# Patient Record
Sex: Female | Born: 1935 | Race: White | Hispanic: No | Smoking: Former smoker
Health system: Southern US, Community
[De-identification: ages and names within clinical notes are randomized; demographics above are authoritative.]

## PROBLEM LIST (undated history)

## (undated) DIAGNOSIS — I10 Essential (primary) hypertension: Secondary | ICD-10-CM

## (undated) DIAGNOSIS — H409 Unspecified glaucoma: Secondary | ICD-10-CM

## (undated) DIAGNOSIS — F0280 Dementia in other diseases classified elsewhere without behavioral disturbance: Secondary | ICD-10-CM

## (undated) DIAGNOSIS — I519 Heart disease, unspecified: Secondary | ICD-10-CM

## (undated) DIAGNOSIS — M810 Age-related osteoporosis without current pathological fracture: Secondary | ICD-10-CM

## (undated) DIAGNOSIS — G309 Alzheimer's disease, unspecified: Secondary | ICD-10-CM

---

## 2014-05-05 ENCOUNTER — Encounter (HOSPITAL_COMMUNITY): Payer: Self-pay | Admitting: Emergency Medicine

## 2014-05-05 ENCOUNTER — Emergency Department (HOSPITAL_COMMUNITY)
Admission: EM | Admit: 2014-05-05 | Discharge: 2014-05-05 | Disposition: A | Discharge status: Home / Self Care | Payer: Medicare Other | Attending: Emergency Medicine | Admitting: Emergency Medicine

## 2014-05-05 DIAGNOSIS — G309 Alzheimer's disease, unspecified: Secondary | ICD-10-CM | POA: Diagnosis not present

## 2014-05-05 DIAGNOSIS — F0390 Unspecified dementia without behavioral disturbance: Secondary | ICD-10-CM

## 2014-05-05 DIAGNOSIS — R531 Weakness: Secondary | ICD-10-CM | POA: Diagnosis present

## 2014-05-05 DIAGNOSIS — R197 Diarrhea, unspecified: Secondary | ICD-10-CM | POA: Insufficient documentation

## 2014-05-05 DIAGNOSIS — Z79899 Other long term (current) drug therapy: Secondary | ICD-10-CM | POA: Diagnosis not present

## 2014-05-05 DIAGNOSIS — F0280 Dementia in other diseases classified elsewhere without behavioral disturbance: Secondary | ICD-10-CM | POA: Diagnosis not present

## 2014-05-05 DIAGNOSIS — Z7982 Long term (current) use of aspirin: Secondary | ICD-10-CM | POA: Insufficient documentation

## 2014-05-05 LAB — CBC WITH DIFFERENTIAL/PLATELET
BASOS ABS: 0 10*3/uL (ref 0.0–0.1)
BASOS PCT: 1 % (ref 0–1)
Eosinophils Absolute: 0.2 10*3/uL (ref 0.0–0.7)
Eosinophils Relative: 5 % (ref 0–5)
HCT: 44 % (ref 36.0–46.0)
Hemoglobin: 14.1 g/dL (ref 12.0–15.0)
Lymphocytes Relative: 23 % (ref 12–46)
Lymphs Abs: 0.9 10*3/uL (ref 0.7–4.0)
MCH: 31.1 pg (ref 26.0–34.0)
MCHC: 32 g/dL (ref 30.0–36.0)
MCV: 96.9 fL (ref 78.0–100.0)
Monocytes Absolute: 0.3 10*3/uL (ref 0.1–1.0)
Monocytes Relative: 8 % (ref 3–12)
NEUTROS PCT: 63 % (ref 43–77)
Neutro Abs: 2.5 10*3/uL (ref 1.7–7.7)
PLATELETS: 163 10*3/uL (ref 150–400)
RBC: 4.54 MIL/uL (ref 3.87–5.11)
RDW: 13.6 % (ref 11.5–15.5)
WBC: 4 10*3/uL (ref 4.0–10.5)

## 2014-05-05 LAB — URINALYSIS, ROUTINE W REFLEX MICROSCOPIC
Bilirubin Urine: NEGATIVE
GLUCOSE, UA: NEGATIVE mg/dL
Hgb urine dipstick: NEGATIVE
KETONES UR: NEGATIVE mg/dL
LEUKOCYTES UA: NEGATIVE
Nitrite: NEGATIVE
PH: 7.5 (ref 5.0–8.0)
Protein, ur: NEGATIVE mg/dL
Specific Gravity, Urine: 1.006 (ref 1.005–1.030)
Urobilinogen, UA: 0.2 mg/dL (ref 0.0–1.0)

## 2014-05-05 LAB — COMPREHENSIVE METABOLIC PANEL
ALBUMIN: 3.7 g/dL (ref 3.5–5.2)
ALT: 9 U/L (ref 0–35)
AST: 20 U/L (ref 0–37)
Alkaline Phosphatase: 97 U/L (ref 39–117)
Anion gap: 11 (ref 5–15)
BUN: 18 mg/dL (ref 6–23)
CHLORIDE: 102 meq/L (ref 96–112)
CO2: 27 mEq/L (ref 19–32)
CREATININE: 1.02 mg/dL (ref 0.50–1.10)
Calcium: 10.1 mg/dL (ref 8.4–10.5)
GFR calc Af Amer: 52 mL/min — ABNORMAL LOW (ref >90)
GFR calc non Af Amer: 45 mL/min — ABNORMAL LOW (ref >90)
Glucose, Bld: 93 mg/dL (ref 70–99)
POTASSIUM: 4.3 meq/L (ref 3.7–5.3)
Sodium: 140 mEq/L (ref 137–147)
Total Bilirubin: 0.5 mg/dL (ref 0.3–1.2)
Total Protein: 7.2 g/dL (ref 6.0–8.3)

## 2014-05-05 LAB — I-STAT TROPONIN, ED: Troponin i, poc: 0.01 ng/mL (ref 0.00–0.08)

## 2014-05-05 NOTE — Discharge Instructions (Signed)
Diarrhea °Diarrhea is watery poop (stool). It can make you feel weak, tired, thirsty, or give you a dry mouth (signs of dehydration). Watery poop is a sign of another problem, most often an infection. It often lasts 2-3 days. It can last longer if it is a sign of something serious. Take care of yourself as told by your doctor. °HOME CARE  °· Drink 1 cup (8 ounces) of fluid each time you have watery poop. °· Do not drink the following fluids: °¨ Those that contain simple sugars (fructose, glucose, galactose, lactose, sucrose, maltose). °¨ Sports drinks. °¨ Fruit juices. °¨ Whole milk products. °¨ Sodas. °¨ Drinks with caffeine (coffee, tea, soda) or alcohol. °· Oral rehydration solution may be used if the doctor says it is okay. You may make your own solution. Follow this recipe: °¨  - teaspoon table salt. °¨ ¾ teaspoon baking soda. °¨  teaspoon salt substitute containing potassium chloride. °¨ 1 tablespoons sugar. °¨ 1 liter (34 ounces) of water. °· Avoid the following foods: °¨ High fiber foods, such as raw fruits and vegetables. °¨ Nuts, seeds, and whole grain breads and cereals. °¨  Those that are sweetened with sugar alcohols (xylitol, sorbitol, mannitol). °· Try eating the following foods: °¨ Starchy foods, such as rice, toast, pasta, low-sugar cereal, oatmeal, baked potatoes, crackers, and bagels. °¨ Bananas. °¨ Applesauce. °· Eat probiotic-rich foods, such as yogurt and milk products that are fermented. °· Wash your hands well after each time you have watery poop. °· Only take medicine as told by your doctor. °· Take a warm bath to help lessen burning or pain from having watery poop. °GET HELP RIGHT AWAY IF:  °· You cannot drink fluids without throwing up (vomiting). °· You keep throwing up. °· You have blood in your poop, or your poop looks black and tarry. °· You do not pee (urinate) in 6-8 hours, or there is only a small amount of very dark pee. °· You have belly (abdominal) pain that gets worse or stays  in the same spot (localizes). °· You are weak, dizzy, confused, or light-headed. °· You have a very bad headache. °· Your watery poop gets worse or does not get better. °· You have a fever or lasting symptoms for more than 2-3 days. °· You have a fever and your symptoms suddenly get worse. °MAKE SURE YOU:  °· Understand these instructions. °· Will watch your condition. °· Will get help right away if you are not doing well or get worse. °Document Released: 12/15/2007 Document Revised: 11/12/2013 Document Reviewed: 03/05/2012 °ExitCare® Patient Information ©2015 [employer]. This information is not intended to replace advice given to you by your health care provider. Make sure you discuss any questions you have with your health care provider. ° °

## 2014-05-05 NOTE — ED Notes (Signed)
[facility] called  

## 2014-05-05 NOTE — ED Notes (Signed)
Patient up ambulating very well

## 2014-05-05 NOTE — ED Notes (Signed)
[facility]Per EMS pt c/o generalized weakness for a few days and and instance of diarrhea today. Pt sts that she believes the facility is giving her the wrong medication. Pt has hx of Alzheimer's. Pt is A&O and in NAD.

## 2014-05-05 NOTE — ED Provider Notes (Signed)
CSN: [id number]     Arrival date & time 05/05/14  1221 History   First MD Initiated Contact with Patient 05/05/14 1256     Chief Complaint  Patient presents with  . Weakness      [facility] EMS pt c/o generalized weakness for a few days and and instance of diarrhea today. Pt sts that she believes the facility is giving her the wrong medication. Pt has hx of Alzheimer's.  History reviewed. No pertinent past medical history. History reviewed. No pertinent past surgical history. No family history on file. History  Substance Use Topics  . Smoking status: Not on file  . Smokeless tobacco: Not on file  . Alcohol Use: No   OB History   Grav Para Term Preterm Abortions TAB SAB Ect Mult Living                 Review of Systems  Unable to perform ROS: Dementia      Allergies  Dairy aid and Eggs or egg-derived products  Home Medications   Prior to Admission medications   Medication Sig Start Date End Date Taking? Authorizing Provider  acetaminophen (TYLENOL) 500 MG tablet Take 500 mg by mouth every 4 (four) hours as needed for mild pain, fever or headache.   Yes Historical Provider, MD  alendronate (FOSAMAX) 70 MG tablet Take 70 mg by mouth once a week. On wednesdays   Yes Historical Provider, MD  alum & mag hydroxide-simeth (MAALOX/MYLANTA) 200-200-20 MG/5ML suspension Take 30 mLs by mouth every 6 (six) hours as needed for indigestion or heartburn (do not exceed 4 doses in 24 hours).   Yes Historical Provider, MD  aspirin 81 MG tablet Take 81 mg by mouth daily.   Yes Historical Provider, MD  benazepril (LOTENSIN) 20 MG tablet Take 20 mg by mouth daily.   Yes Historical Provider, MD  Calcium Carb-Cholecalciferol (CALCIUM-VITAMIN D) 600-400 MG-UNIT TABS Take 1 tablet by mouth 2 (two) times daily.   Yes Historical Provider, MD  cefaclor (CECLOR) 250 MG capsule Take 250 mg by mouth daily.   Yes Historical Provider, MD  citalopram (CELEXA) 20 MG tablet Take  20 mg by mouth daily.   Yes Historical Provider, MD  furosemide (LASIX) 20 MG tablet Take 20 mg by mouth daily as needed.   Yes Historical Provider, MD  guaifenesin (ROBITUSSIN) 100 MG/5ML syrup Take 200 mg by mouth every 6 (six) hours as needed for cough.   Yes Historical Provider, MD  loperamide (IMODIUM) 2 MG capsule Take 2 mg by mouth as needed for diarrhea or loose stools (do not exceed 8 doses in 24 hours).   Yes Historical Provider, MD  magnesium hydroxide (MILK OF MAGNESIA) 400 MG/5ML suspension Take 30 mLs by mouth daily as needed for mild constipation.   Yes Historical Provider, MD  Memantine HCl ER (NAMENDA XR) 28 MG CP24 Take 1 capsule by mouth daily.   Yes Historical Provider, MD  neomycin-bacitracin-polymyxin (NEOSPORIN) ointment Apply 1 application topically as needed for wound care. apply to eye   Yes Historical Provider, MD  propranolol (INDERAL) 20 MG tablet Take 20 mg by mouth 2 (two) times daily.   Yes Historical Provider, MD  Vitamin D, Ergocalciferol, (DRISDOL) 50000 UNITS CAPS capsule Take 50,000 Units by mouth every 7 (seven) days. On wednesday   Yes Historical Provider, MD   BP 212/82  Pulse 54  Temp(Src) 97.9 F (36.6 C) (Oral)  Resp 10  SpO2 100% Physical Exam  Physical Exam  Nursing note and vitals reviewed. Constitutional: Patient is awake and talkative.  She appears well-developed and well-nourished. No distress.  HENT:  Head: Normocephalic and atraumatic.  Eyes: Pupils are equal, round, and reactive to light.  Neck: Normal range of motion.  Cardiovascular: Normal rate and intact distal pulses.   Pulmonary/Chest: No respiratory distress.  Abdominal: Normal appearance. She exhibits no distension.  Musculoskeletal: Normal range of motion.  Neurological: She is alert and oriented to person, place, and time. No cranial nerve deficit.  Skin: Skin is warm and dry. No rash noted.  Psychiatric: She has a normal mood and affect. Her behavior is normal.   ED Course   Procedures (including critical care time) Medications - No data to display  Labs Review Labs Reviewed  COMPREHENSIVE METABOLIC PANEL - Abnormal; Notable for the following:    GFR calc non Af Amer 45 (*)    GFR calc Af Amer 52 (*)    All other components within normal limits  CBC WITH DIFFERENTIAL  URINALYSIS, ROUTINE W REFLEX MICROSCOPIC  I-STAT TROPOININ, ED    Imaging Review No results found.   EKG Interpretation   Date/Time:  Sunday May 05 2014 13:10:05 EDT Ventricular Rate:  55 PR Interval:  184 QRS Duration: 82 QT Interval:  465 QTC Calculation: 445 R Axis:   45 Text Interpretation:  Sinus rhythm Atrial premature complex Probable  anterior infarct, age indeterminate Confirmed by Darothy Courtright  MD, Mendel Binsfeld  [phone]([id number]) on 05/05/2014 2:31:57 PM     Patient was ambulated in the hall and was happy and dancing.  At this time she appears stable for discharge MDM   Final diagnoses:  Dementia, without behavioral disturbance  Diarrhea        Nelia Shiobert L Inigo Lantigua, MD 05/05/14 1435

## 2014-05-05 NOTE — ED Notes (Signed)
Bed: WA01 Expected date: 05/05/14 Expected time: 12:06 PM Means of arrival: Ambulance Comments: Gen weakness

## 2014-07-08 ENCOUNTER — Emergency Department (HOSPITAL_COMMUNITY)
Admission: EM | Admit: 2014-07-08 | Discharge: 2014-07-08 | Disposition: A | Discharge status: Home / Self Care | Payer: Medicare Other | Attending: Emergency Medicine | Admitting: Emergency Medicine

## 2014-07-08 ENCOUNTER — Encounter (HOSPITAL_COMMUNITY): Payer: Self-pay

## 2014-07-08 DIAGNOSIS — Y998 Other external cause status: Secondary | ICD-10-CM | POA: Diagnosis not present

## 2014-07-08 DIAGNOSIS — Y9289 Other specified places as the place of occurrence of the external cause: Secondary | ICD-10-CM | POA: Diagnosis not present

## 2014-07-08 DIAGNOSIS — Z79899 Other long term (current) drug therapy: Secondary | ICD-10-CM | POA: Insufficient documentation

## 2014-07-08 DIAGNOSIS — Y9389 Activity, other specified: Secondary | ICD-10-CM | POA: Insufficient documentation

## 2014-07-08 DIAGNOSIS — S0532XA Ocular laceration without prolapse or loss of intraocular tissue, left eye, initial encounter: Secondary | ICD-10-CM | POA: Insufficient documentation

## 2014-07-08 DIAGNOSIS — W01198A Fall on same level from slipping, tripping and stumbling with subsequent striking against other object, initial encounter: Secondary | ICD-10-CM | POA: Diagnosis not present

## 2014-07-08 DIAGNOSIS — S0181XA Laceration without foreign body of other part of head, initial encounter: Secondary | ICD-10-CM

## 2014-07-08 DIAGNOSIS — Z23 Encounter for immunization: Secondary | ICD-10-CM | POA: Insufficient documentation

## 2014-07-08 DIAGNOSIS — W010XXA Fall on same level from slipping, tripping and stumbling without subsequent striking against object, initial encounter: Secondary | ICD-10-CM

## 2014-07-08 MED ORDER — TETANUS-DIPHTH-ACELL PERTUSSIS 5-2.5-18.5 LF-MCG/0.5 IM SUSP
0.5000 mL | Freq: Once | INTRAMUSCULAR | Status: AC
  Administered 2014-07-08: 0.5 mL via INTRAMUSCULAR
  Filled 2014-07-08: qty 0.5

## 2014-07-08 MED ORDER — ACETAMINOPHEN 325 MG PO TABS
650.0000 mg | ORAL_TABLET | Freq: Once | ORAL | Status: AC
  Administered 2014-07-08: 650 mg via ORAL
  Filled 2014-07-08: qty 2

## 2014-07-08 NOTE — ED Notes (Signed)
Facility called and made aware that pt's DNR form was left in the ER. Form left behind [employer].

## 2014-07-08 NOTE — ED Notes (Signed)
Bed: WHALD Expected date:  Expected time:  Means of arrival:  Comments: EMS- FALL 

## 2014-07-08 NOTE — ED Notes (Signed)
MD at bedside. 

## 2014-07-08 NOTE — ED Notes (Signed)
Bed: WA21 Expected date:  Expected time:  Means of arrival:  Comments: Pt in endoscopy; will return

## 2014-07-08 NOTE — Discharge Instructions (Signed)
It was our pleasure to provide your ER care today - we hope that you feel better.  Keep wound very clean. May gently wash with warm water and soap 1-2x/day.  Have sutures removed, by your doctor/PA, in 1 week.   May take tylenol as need.   Use your walker for added stability when walking, and to help minimize the risk of falling.   Return to ER if worse, change in mental status, vomiting, infection of wound, severe headache, other concern.     Facial Laceration  A facial laceration is a cut on the face. These injuries can be painful and cause bleeding. Lacerations usually heal quickly, but they need special care to reduce scarring. DIAGNOSIS  Your health care provider will take a medical history, ask for details about how the injury occurred, and examine the wound to determine how deep the cut is. TREATMENT  Some facial lacerations may not require closure. Others may not be able to be closed because of an increased risk of infection. The risk of infection and the chance for successful closure will depend on various factors, including the amount of time since the injury occurred. The wound may be cleaned to help prevent infection. If closure is appropriate, pain medicines may be given if needed. Your health care provider will use stitches (sutures), wound glue (adhesive), or skin adhesive strips to repair the laceration. These tools bring the skin edges together to allow for faster healing and a better cosmetic outcome. If needed, you may also be given a tetanus shot. HOME CARE INSTRUCTIONS  Only take over-the-counter or prescription medicines as directed by your health care provider.  Follow your health care provider's instructions for wound care. These instructions will vary depending on the technique used for closing the wound. For Sutures:  Keep the wound clean and dry.   If you were given a bandage (dressing), you should change it at least once a day. Also change the dressing if  it becomes wet or dirty, or as directed by your health care provider.   Wash the wound with soap and water 2 times a day. Rinse the wound off with water to remove all soap. Pat the wound dry with a clean towel.   After cleaning, apply a thin layer of the antibiotic ointment recommended by your health care provider. This will help prevent infection and keep the dressing from sticking.   You may shower as usual after the first 24 hours. Do not soak the wound in water until the sutures are removed.   Get your sutures removed as directed by your health care provider. With facial lacerations, sutures should usually be taken out after 4-5 days to avoid stitch marks.   Wait a few days after your sutures are removed before applying any makeup. For Skin Adhesive Strips:  Keep the wound clean and dry.   Do not get the skin adhesive strips wet. You may bathe carefully, using caution to keep the wound dry.   If the wound gets wet, pat it dry with a clean towel.   Skin adhesive strips will fall off on their own. You may trim the strips as the wound heals. Do not remove skin adhesive strips that are still stuck to the wound. They will fall off in time.  For Wound Adhesive:  You may briefly wet your wound in the shower or bath. Do not soak or scrub the wound. Do not swim. Avoid periods of heavy sweating until the skin adhesive has  fallen off on its own. After showering or bathing, gently pat the wound dry with a clean towel.   Do not apply liquid medicine, cream medicine, ointment medicine, or makeup to your wound while the skin adhesive is in place. This may loosen the film before your wound is healed.   If a dressing is placed over the wound, be careful not to apply tape directly over the skin adhesive. This may cause the adhesive to be pulled off before the wound is healed.   Avoid prolonged exposure to sunlight or tanning lamps while the skin adhesive is in place.  The skin adhesive  will usually remain in place for 5-10 days, then naturally fall off the skin. Do not pick at the adhesive film.  After Healing: Once the wound has healed, cover the wound with sunscreen during the day for 1 full year. This can help minimize scarring. Exposure to ultraviolet light in the first year will darken the scar. It can take 1-2 years for the scar to lose its redness and to heal completely.  SEEK IMMEDIATE MEDICAL CARE IF:  You have redness, pain, or swelling around the wound.   You see ayellowish-white fluid (pus) coming from the wound.   You have chills or a fever.  MAKE SURE YOU:  Understand these instructions.  Will watch your condition.  Will get help right away if you are not doing well or get worse. Document Released: 08/05/2004 Document Revised: 04/18/2013 Document Reviewed: 02/08/2013 [facility] Patient Information 2015 [place], [place]. This information is not intended to replace advice given to you by your health care provider. Make sure you discuss any questions you have with your health care provider.     Fall Prevention and Home Safety Falls cause injuries and can affect all age groups. It is possible to prevent falls.  HOW TO PREVENT FALLS  Wear shoes with rubber soles that do not have an opening for your toes.  Keep the inside and outside of your house well lit.  Use night lights throughout your home.  Remove clutter from floors.  Clean up floor spills.  Remove throw rugs or fasten them to the floor with carpet tape.  Do not place electrical cords across pathways.  Put grab bars by your tub, shower, and toilet. Do not use towel bars as grab bars.  Put handrails on both sides of the stairway. Fix loose handrails.  Do not climb on stools or stepladders, if possible.  Do not wax your floors.  Repair uneven or unsafe sidewalks, walkways, or stairs.  Keep items you use a lot within reach.  Be aware of pets.  Keep emergency numbers next to the  telephone.  Put smoke detectors in your home and near bedrooms. Ask your doctor what other things you can do to prevent falls. Document Released: 04/24/2009 Document Revised: 12/28/2011 Document Reviewed: 09/28/2011 [facility] Patient Information 2015 [place], [place]. This information is not intended to replace advice given to you by your health care provider. Make sure you discuss any questions you have with your health care provider.    Facial or Scalp Contusion A facial or scalp contusion is a deep bruise on the face or head. Injuries to the face and head generally cause a lot of swelling, especially around the eyes. Contusions are the result of an injury that caused bleeding under the skin. The contusion may turn blue, purple, or yellow. Minor injuries will give you a painless contusion, but more severe contusions may stay painful and  swollen for a few weeks.  CAUSES  A facial or scalp contusion is caused by a blunt injury or trauma to the face or head area.  SIGNS AND SYMPTOMS  Swelling of the injured area.  Discoloration of the injured area.  Tenderness, soreness, or pain in the injured area.  DIAGNOSIS  The diagnosis can be made by taking a medical history and doing a physical exam. An X-ray exam, CT scan, or MRI may be needed to determine if there are any associated injuries, such as broken bones (fractures). TREATMENT  Often, the best treatment for a facial or scalp contusion is applying cold compresses to the injured area. Over-the-counter medicines may also be recommended for pain control.  HOME CARE INSTRUCTIONS  Only take over-the-counter or prescription medicines as directed by your health care provider.  Apply ice to the injured area.  Put ice in a plastic bag.  Place a towel between your skin and the bag.  Leave the ice on for 20 minutes, 2-3 times a day.  SEEK MEDICAL CARE IF: You have bite problems.  You have pain with chewing.  You are concerned about facial  defects. SEEK IMMEDIATE MEDICAL CARE IF: You have severe pain or a headache that is not relieved by medicine.  You have unusual sleepiness, confusion, or personality changes.  You throw up (vomit).  You have a persistent nosebleed.  You have double vision or blurred vision.  You have fluid drainage from your nose or ear.  You have difficulty walking or using your arms or legs.  MAKE SURE YOU:  Understand these instructions. Will watch your condition. Will get help right away if you are not doing well or get worse. Document Released: 08/05/2004 Document Revised: 04/18/2013 Document Reviewed: 02/08/2013 [facility] Patient Information 2015 [place], [place]. This information is not intended to replace advice given to you by your health care provider. Make sure you discuss any questions you have with your health care provider.     Laceration Care, Adult A laceration is a cut or lesion that goes through all layers of the skin and into the tissue just beneath the skin. TREATMENT  Some lacerations may not require closure. Some lacerations may not be able to be closed due to an increased risk of infection. It is important to see your caregiver as soon as possible after an injury to minimize the risk of infection and maximize the opportunity for successful closure. If closure is appropriate, pain medicines may be given, if needed. The wound will be cleaned to help prevent infection. Your caregiver will use stitches (sutures), staples, wound glue (adhesive), or skin adhesive strips to repair the laceration. These tools bring the skin edges together to allow for faster healing and a better cosmetic outcome. However, all wounds will heal with a scar. Once the wound has healed, scarring can be minimized by covering the wound with sunscreen during the day for 1 full year. HOME CARE INSTRUCTIONS  For sutures or staples:  Keep the wound clean and dry.  If you were given a bandage (dressing), you  should change it at least once a day. Also, change the dressing if it becomes wet or dirty, or as directed by your caregiver.  Wash the wound with soap and water 2 times a day. Rinse the wound off with water to remove all soap. Pat the wound dry with a clean towel.  After cleaning, apply a thin layer of the antibiotic ointment as recommended by your caregiver. This will help  prevent infection and keep the dressing from sticking.  You may shower as usual after the first 24 hours. Do not soak the wound in water until the sutures are removed.  Only take over-the-counter or prescription medicines for pain, discomfort, or fever as directed by your caregiver.  Get your sutures or staples removed as directed by your caregiver. For skin adhesive strips:  Keep the wound clean and dry.  Do not get the skin adhesive strips wet. You may bathe carefully, using caution to keep the wound dry.  If the wound gets wet, pat it dry with a clean towel.  Skin adhesive strips will fall off on their own. You may trim the strips as the wound heals. Do not remove skin adhesive strips that are still stuck to the wound. They will fall off in time. For wound adhesive:  You may briefly wet your wound in the shower or bath. Do not soak or scrub the wound. Do not swim. Avoid periods of heavy perspiration until the skin adhesive has fallen off on its own. After showering or bathing, gently pat the wound dry with a clean towel.  Do not apply liquid medicine, cream medicine, or ointment medicine to your wound while the skin adhesive is in place. This may loosen the film before your wound is healed.  If a dressing is placed over the wound, be careful not to apply tape directly over the skin adhesive. This may cause the adhesive to be pulled off before the wound is healed.  Avoid prolonged exposure to sunlight or tanning lamps while the skin adhesive is in place. Exposure to ultraviolet light in the first year will darken  the scar.  The skin adhesive will usually remain in place for 5 to 10 days, then naturally fall off the skin. Do not pick at the adhesive film. You may need a tetanus shot if:  You cannot remember when you had your last tetanus shot.  You have never had a tetanus shot. If you get a tetanus shot, your arm may swell, get red, and feel warm to the touch. This is common and not a problem. If you need a tetanus shot and you choose not to have one, there is a rare chance of getting tetanus. Sickness from tetanus can be serious. SEEK MEDICAL CARE IF:   You have redness, swelling, or increasing pain in the wound.  You see a red line that goes away from the wound.  You have yellowish-white fluid (pus) coming from the wound.  You have a fever.  You notice a bad smell coming from the wound or dressing.  Your wound breaks open before or after sutures have been removed.  You notice something coming out of the wound such as wood or glass.  Your wound is on your hand or foot and you cannot move a finger or toe. SEEK IMMEDIATE MEDICAL CARE IF:   Your pain is not controlled with prescribed medicine.  You have severe swelling around the wound causing pain and numbness or a change in color in your arm, hand, leg, or foot.  Your wound splits open and starts bleeding.  You have worsening numbness, weakness, or loss of function of any joint around or beyond the wound.  You develop painful lumps near the wound or on the skin anywhere on your body. MAKE SURE YOU:   Understand these instructions.  Will watch your condition.  Will get help right away if you are not doing well or get  worse. Document Released: 06/28/2005 Document Revised: 09/20/2011 Document Reviewed: 12/22/2010 [facility] Patient Information 2015 [place], [place]. This information is not intended to replace advice given to you by your health care provider. Make sure you discuss any questions you have with your health care  provider.

## 2014-07-08 NOTE — ED Provider Notes (Signed)
CSN: [id number]     Arrival date & time 07/08/14  1300 History   First MD Initiated Contact with Patient 07/08/14 1312     Chief Complaint  Patient presents with  . Fall     (Consider location/radiation/quality/duration/timing/severity/associated sxs/prior Treatment) HPI Pt from [facility], s/p fall.  Pt states she slipped on some water on the floor, fell and hit left side of face, just lateral to left eye. Small laceration to area. Tetanus unknown. Denies loc. No faintness or dizziness prior to fall. No cp or sob. No palpitations. No headache. Denies nv.  Pt denies neck or back pain. Denies extremity pain or injury.  States she has a walker, but prefers not to use it. Denies other recent falls.   Pt states otherwise recent health at baseline.     History reviewed. No pertinent past medical history. History reviewed. No pertinent past surgical history. No family history on file. History  Substance Use Topics  . Smoking status: Not on file  . Smokeless tobacco: Not on file  . Alcohol Use: No   OB History    No data available     Review of Systems  Constitutional: Negative for fever and chills.  HENT: Negative for nosebleeds.   Eyes: Negative for pain and visual disturbance.  Respiratory: Negative for shortness of breath.   Cardiovascular: Negative for chest pain.  Gastrointestinal: Negative for vomiting, abdominal pain and diarrhea.  Genitourinary: Negative for dysuria and flank pain.  Musculoskeletal: Negative for back pain and neck pain.  Skin: Positive for wound. Negative for rash.  Neurological: Negative for weakness, numbness and headaches.  Hematological: Does not bruise/bleed easily.  Psychiatric/Behavioral: Negative for confusion.      Allergies  Dairy aid and Eggs or egg-derived products  Home Medications   Prior to Admission medications   Medication Sig Start Date End Date Taking? Authorizing Provider  alendronate (FOSAMAX) 70 MG tablet Take 70 mg by mouth once  a week. On wednesdays   Yes Historical Provider, MD  acetaminophen (TYLENOL) 500 MG tablet Take 500 mg by mouth every 4 (four) hours as needed for mild pain, fever or headache.    Historical Provider, MD  alum & mag hydroxide-simeth (MAALOX/MYLANTA) 200-200-20 MG/5ML suspension Take 30 mLs by mouth every 6 (six) hours as needed for indigestion or heartburn (do not exceed 4 doses in 24 hours).    Historical Provider, MD  benazepril (LOTENSIN) 20 MG tablet Take 20 mg by mouth daily.    Historical Provider, MD  Calcium Carb-Cholecalciferol (CALCIUM-VITAMIN D) 600-400 MG-UNIT TABS Take 1 tablet by mouth 2 (two) times daily.    Historical Provider, MD  cefaclor (CECLOR) 250 MG capsule Take 250 mg by mouth daily.    Historical Provider, MD  citalopram (CELEXA) 20 MG tablet Take 20 mg by mouth daily.    Historical Provider, MD  furosemide (LASIX) 20 MG tablet Take 20 mg by mouth daily as needed.    Historical Provider, MD  guaifenesin (ROBITUSSIN) 100 MG/5ML syrup Take 200 mg by mouth every 6 (six) hours as needed for cough.    Historical Provider, MD  loperamide (IMODIUM) 2 MG capsule Take 2 mg by mouth as needed for diarrhea or loose stools (do not exceed 8 doses in 24 hours).    Historical Provider, MD  magnesium hydroxide (MILK OF MAGNESIA) 400 MG/5ML suspension Take 30 mLs by mouth daily as needed for mild constipation.    Historical Provider, MD  Memantine HCl ER (NAMENDA XR) 28 MG  CP24 Take 1 capsule by mouth daily.    Historical Provider, MD  neomycin-bacitracin-polymyxin (NEOSPORIN) ointment Apply 1 application topically as needed for wound care. apply to eye    Historical Provider, MD  propranolol (INDERAL) 20 MG tablet Take 20 mg by mouth 2 (two) times daily.    Historical Provider, MD  Vitamin D, Ergocalciferol, (DRISDOL) 50000 UNITS CAPS capsule Take 50,000 Units by mouth every 7 (seven) days. On wednesday    Historical Provider, MD   BP 172/68 mmHg  Pulse 55  Temp(Src) 98 F (36.7 C) (Oral)   Resp 18  SpO2 95% Physical Exam  Constitutional: She appears well-developed and well-nourished. No distress.  HENT:  Nose: Nose normal.  Mouth/Throat: Oropharynx is clear and moist.  1.5 cm laceration lateral to left eye. No fb seen or felt. Facial bones/orbits grossly intact.   Eyes: Conjunctivae and EOM are normal. Pupils are equal, round, and reactive to light. No scleral icterus.  Neck: Normal range of motion. Neck supple. No tracheal deviation present.  Cardiovascular: Normal rate, regular rhythm, normal heart sounds and intact distal pulses.   Pulmonary/Chest: Effort normal and breath sounds normal. No respiratory distress. She exhibits no tenderness.  Abdominal: Soft. Normal appearance and bowel sounds are normal. She exhibits no distension. There is no tenderness.  Musculoskeletal: Normal range of motion. She exhibits no edema or tenderness.  CTLS spine, non tender, aligned, no step off.   Neurological: She is alert.  Alert, oriented. Motor intact bil, stre 5/5. sens intact. Ambulates to bathroom.   Skin: Skin is warm and dry. No rash noted. She is not diaphoretic.  Psychiatric: She has a normal mood and affect.  Nursing note and vitals reviewed.   ED Course  Procedures (including critical care time) Labs Review      MDM   Pt s/p slip and fall at [facility].  Pt denies loc, no headache.  Wound sutured.  Pt continues to deny headache.  No nv. Spine nt.  Pt ambulatory to bathroom, states feels fine.   Recheck family here. pts mental status remains c/w baseline.  No headache. Spine nt.  No bony tenderness or pain on bil ext exam.   Tetanus updated. Tylenol po.  Pt appears stable for d/c.     Suzi RootsKevin E Danna Casella, MD 07/08/14 [phone]

## 2014-07-08 NOTE — ED Notes (Signed)
[facility]. Pt had an unwitnessed fall. Pt reports she slipped on some water, fell and hit her head. Pt has a laceration to her left eyebrow, bleeding controlled. Pt has some hx of memory loss, no dementia noted. Pt was able to ambulate after the fall.

## 2014-07-16 ENCOUNTER — Emergency Department (HOSPITAL_COMMUNITY)
Admission: EM | Admit: 2014-07-16 | Discharge: 2014-07-16 | Disposition: A | Discharge status: Home / Self Care | Payer: Medicare Other | Attending: Emergency Medicine | Admitting: Emergency Medicine

## 2014-07-16 ENCOUNTER — Encounter (HOSPITAL_COMMUNITY): Payer: Self-pay | Admitting: Emergency Medicine

## 2014-07-16 DIAGNOSIS — K644 Residual hemorrhoidal skin tags: Secondary | ICD-10-CM | POA: Diagnosis not present

## 2014-07-16 DIAGNOSIS — K625 Hemorrhage of anus and rectum: Secondary | ICD-10-CM | POA: Diagnosis present

## 2014-07-16 DIAGNOSIS — Z79899 Other long term (current) drug therapy: Secondary | ICD-10-CM | POA: Insufficient documentation

## 2014-07-16 DIAGNOSIS — Z7982 Long term (current) use of aspirin: Secondary | ICD-10-CM | POA: Diagnosis not present

## 2014-07-16 DIAGNOSIS — K649 Unspecified hemorrhoids: Secondary | ICD-10-CM

## 2014-07-16 LAB — CBC WITH DIFFERENTIAL/PLATELET
BASOS ABS: 0 10*3/uL (ref 0.0–0.1)
BASOS PCT: 1 % (ref 0–1)
EOS PCT: 7 % — AB (ref 0–5)
Eosinophils Absolute: 0.3 10*3/uL (ref 0.0–0.7)
HCT: 40.7 % (ref 36.0–46.0)
Hemoglobin: 13.3 g/dL (ref 12.0–15.0)
Lymphocytes Relative: 21 % (ref 12–46)
Lymphs Abs: 0.8 10*3/uL (ref 0.7–4.0)
MCH: 30.6 pg (ref 26.0–34.0)
MCHC: 32.7 g/dL (ref 30.0–36.0)
MCV: 93.6 fL (ref 78.0–100.0)
Monocytes Absolute: 0.3 10*3/uL (ref 0.1–1.0)
Monocytes Relative: 8 % (ref 3–12)
NEUTROS ABS: 2.4 10*3/uL (ref 1.7–7.7)
Neutrophils Relative %: 63 % (ref 43–77)
Platelets: 124 10*3/uL — ABNORMAL LOW (ref 150–400)
RBC: 4.35 MIL/uL (ref 3.87–5.11)
RDW: 13.4 % (ref 11.5–15.5)
WBC: 3.7 10*3/uL — AB (ref 4.0–10.5)

## 2014-07-16 LAB — PROTIME-INR
INR: 1.03 (ref 0.00–1.49)
Prothrombin Time: 13.6 seconds (ref 11.6–15.2)

## 2014-07-16 LAB — COMPREHENSIVE METABOLIC PANEL
ALT: 11 U/L (ref 0–35)
ANION GAP: 8 (ref 5–15)
AST: 22 U/L (ref 0–37)
Albumin: 3.4 g/dL — ABNORMAL LOW (ref 3.5–5.2)
Alkaline Phosphatase: 74 U/L (ref 39–117)
BUN: 19 mg/dL (ref 6–23)
CALCIUM: 9.3 mg/dL (ref 8.4–10.5)
CO2: 29 mmol/L (ref 19–32)
CREATININE: 1.12 mg/dL — AB (ref 0.50–1.10)
Chloride: 104 mEq/L (ref 96–112)
GFR calc Af Amer: 47 mL/min — ABNORMAL LOW (ref >90)
GFR calc non Af Amer: 40 mL/min — ABNORMAL LOW (ref >90)
Glucose, Bld: 91 mg/dL (ref 70–99)
Potassium: 4.1 mmol/L (ref 3.5–5.1)
SODIUM: 141 mmol/L (ref 135–145)
TOTAL PROTEIN: 6.2 g/dL (ref 6.0–8.3)
Total Bilirubin: 0.7 mg/dL (ref 0.3–1.2)

## 2014-07-16 LAB — POC OCCULT BLOOD, ED: FECAL OCCULT BLD: NEGATIVE

## 2014-07-16 MED ORDER — BENAZEPRIL HCL 20 MG PO TABS
20.0000 mg | ORAL_TABLET | Freq: Once | ORAL | Status: AC
  Administered 2014-07-16: 20 mg via ORAL
  Filled 2014-07-16 (×2): qty 1

## 2014-07-16 MED ORDER — PROPRANOLOL HCL 20 MG PO TABS
20.0000 mg | ORAL_TABLET | Freq: Once | ORAL | Status: AC
  Administered 2014-07-16: 20 mg via ORAL
  Filled 2014-07-16: qty 1

## 2014-07-16 NOTE — Discharge Instructions (Signed)
Bloody Stools °Bloody stools often mean that there is a problem in the digestive tract. Your caregiver may use the term "melena" to describe black, tarry, and bad smelling stools or "hematochezia" to describe red or maroon-colored stools. Blood seen in the stool can be caused by bleeding anywhere along the intestinal tract.  °A black stool usually means that blood is coming from the upper part of the gastrointestinal tract (esophagus, stomach, or small bowel). Passing maroon-colored stools or bright red blood usually means that blood is coming from lower down in the large bowel or the rectum. However, sometimes massive bleeding in the stomach or small intestine can cause bright red bloody stools.  °Consuming black licorice, lead, iron pills, medicines containing bismuth subsalicylate, or blueberries can also cause black stools. Your caregiver can test black stools to see if blood is present. °It is important that the cause of the bleeding be found. Treatment can then be started, and the problem can be corrected. Rectal bleeding may not be serious, but you should not assume everything is okay until you know the cause. It is very important to follow up with your caregiver or a specialist in gastrointestinal problems. °CAUSES  °Blood in the stools can come from various underlying causes. Often, the cause is not found during your first visit. Testing is often needed to discover the cause of bleeding in the gastrointestinal tract. Causes range from simple to serious or even life-threatening. Possible causes include: °· Hemorrhoids. These are veins that are full of blood (engorged) in the rectum. They cause pain, inflammation, and may bleed. °· Anal fissures. These are areas of painful tearing which may bleed. They are often caused by passing hard stool. °· Diverticulosis. These are pouches that form on the colon over time, with age, and may bleed significantly. °· Diverticulitis. This is inflammation in areas with  diverticulosis. It can cause pain, fever, and bloody stools, although bleeding is rare. °· Proctitis and colitis. These are inflamed areas of the rectum or colon. They may cause pain, fever, and bloody stools. °· Polyps and cancer. Colon cancer is a leading cause of preventable cancer death. It often starts out as precancerous polyps that can be removed during a colonoscopy, preventing progression into cancer. Sometimes, polyps and cancer may cause rectal bleeding. °· Gastritis and ulcers. Bleeding from the upper gastrointestinal tract (near the stomach) may travel through the intestines and produce black, sometimes tarry, often bad smelling stools. In certain cases, if the bleeding is fast enough, the stools may not be black, but red and the condition may be life-threatening. °SYMPTOMS  °You may have stools that are bright red and bloody, that are normal color with blood on them, or that are dark black and tarry. In some cases, you may only have blood in the toilet bowl. Any of these cases need medical care. You may also have: °· Pain at the anus or anywhere in the rectum. °· Lightheadedness or feeling faint. °· Extreme weakness. °· Nausea or vomiting. °· Fever. °DIAGNOSIS °Your caregiver may use the following methods to find the cause of your bleeding: °· Taking a medical history. Age is important. Older people tend to develop polyps and cancer more often. If there is anal pain and a hard, large stool associated with bleeding, a tear of the anus may be the cause. If blood drips into the toilet after a bowel movement, bleeding hemorrhoids may be the problem. The color and frequency of the bleeding are additional considerations. In most cases, the medical history provides clues, but seldom the final   answer. °· A visual and finger (digital) exam. Your caregiver will inspect the anal area, looking for tears and hemorrhoids. A finger exam can provide information when there is tenderness or a growth inside. In men, the  prostate is also examined. °· Endoscopy. Several types of small, long scopes (endoscopes) are used to view the colon. °· In the office, your caregiver may use a rigid, or more commonly, a flexible viewing sigmoidoscope. This exam is called flexible sigmoidoscopy. It is performed in 5 to 10 minutes. °· A more thorough exam is accomplished with a colonoscope. It allows your caregiver to view the entire 5 to 6 foot long colon. Medicine to help you relax (sedative) is usually given for this exam. Frequently, a bleeding lesion may be present beyond the reach of the sigmoidoscope. So, a colonoscopy may be the best exam to start with. Both exams are usually done on an outpatient basis. This means the patient does not stay overnight in the hospital or surgery center. °· An upper endoscopy may be needed to examine your stomach. Sedation is used and a flexible endoscope is put in your mouth, down to your stomach. °· A barium enema X-ray. This is an X-ray exam. It uses liquid barium inserted by enema into the rectum. This test alone may not identify an actual bleeding point. X-rays highlight abnormal shadows, such as those made by lumps (tumors), diverticuli, or colitis. °TREATMENT  °Treatment depends on the cause of your bleeding.  °· For bleeding from the stomach or colon, the caregiver doing your endoscopy or colonoscopy may be able to stop the bleeding as part of the procedure. °· Inflammation or infection of the colon can be treated with medicines. °· Many rectal problems can be treated with creams, suppositories, or warm baths. °· Surgery is sometimes needed. °· Blood transfusions are sometimes needed if you have lost a lot of blood. °· For any bleeding problem, let your caregiver know if you take aspirin or other blood thinners regularly. °HOME CARE INSTRUCTIONS  °· Take any medicines exactly as prescribed. °· Keep your stools soft by eating a diet high in fiber. Prunes (1 to 3 a day) work well for many people. °· Drink  enough water and fluids to keep your urine clear or pale yellow. °· Take sitz baths if advised. A sitz bath is when you sit in a bathtub with warm water for 10 to 15 minutes to soak, soothe, and cleanse the rectal area. °· If enemas or suppositories are advised, be sure you know how to use them. Tell your caregiver if you have problems with this. °· Monitor your bowel movements to look for signs of improvement or worsening. °SEEK MEDICAL CARE IF:  °· You do not improve in the time expected. °· Your condition worsens after initial improvement. °· You develop any new symptoms. °SEEK IMMEDIATE MEDICAL CARE IF:  °· You develop severe or prolonged rectal bleeding. °· You vomit blood. °· You feel weak or faint. °· You have a fever. °MAKE SURE YOU: °· Understand these instructions. °· Will watch your condition. °· Will get help right away if you are not doing well or get worse. °Document Released: 06/18/2002 Document Revised: 09/20/2011 Document Reviewed: 11/13/2010 °[employer]® Patient Information ©2015 [employer]. This information is not intended to replace advice given to you by your health care provider. Make sure you discuss any questions you have with your health care provider. ° °Hemorrhoids °Hemorrhoids are swollen veins around the rectum or anus. There are   two types of hemorrhoids:  °· Internal hemorrhoids. These occur in the veins just inside the rectum. They may poke through to the outside and become irritated and painful. °· External hemorrhoids. These occur in the veins outside the anus and can be felt as a painful swelling or hard lump near the anus. °CAUSES °· Pregnancy.   °· Obesity.   °· Constipation or diarrhea.   °· Straining to have a bowel movement.   °· Sitting for long periods on the toilet. °· Heavy lifting or other activity that caused you to strain. °· Anal intercourse. °SYMPTOMS  °· Pain.   °· Anal itching or irritation.   °· Rectal bleeding.   °· Fecal leakage.   °· Anal swelling.   °· One or  more lumps around the anus.   °DIAGNOSIS  °Your caregiver may be able to diagnose hemorrhoids by visual examination. Other examinations or tests that may be performed include:  °· Examination of the rectal area with a gloved hand (digital rectal exam).   °· Examination of anal canal using a small tube (scope).   °· A blood test if you have lost a significant amount of blood. °· A test to look inside the colon (sigmoidoscopy or colonoscopy). °TREATMENT °Most hemorrhoids can be treated at home. However, if symptoms do not seem to be getting better or if you have a lot of rectal bleeding, your caregiver may perform a procedure to help make the hemorrhoids get smaller or remove them completely. Possible treatments include:  °· Placing a rubber band at the base of the hemorrhoid to cut off the circulation (rubber band ligation).   °· Injecting a chemical to shrink the hemorrhoid (sclerotherapy).   °· Using a tool to burn the hemorrhoid (infrared light therapy).   °· Surgically removing the hemorrhoid (hemorrhoidectomy).   °· Stapling the hemorrhoid to block blood flow to the tissue (hemorrhoid stapling).   °HOME CARE INSTRUCTIONS  °· Eat foods with fiber, such as whole grains, beans, nuts, fruits, and vegetables. Ask your doctor about taking products with added fiber in them (fiber supplements). °· Increase fluid intake. Drink enough water and fluids to keep your urine clear or pale yellow.   °· Exercise regularly.   °· Go to the bathroom when you have the urge to have a bowel movement. Do not wait.   °· Avoid straining to have bowel movements.   °· Keep the anal area dry and clean. Use wet toilet paper or moist towelettes after a bowel movement.   °· Medicated creams and suppositories may be used or applied as directed.   °· Only take over-the-counter or prescription medicines as directed by your caregiver.   °· Take warm sitz baths for 15-20 minutes, 3-4 times a day to ease pain and discomfort.   °· Place ice packs on  the hemorrhoids if they are tender and swollen. Using ice packs between sitz baths may be helpful.   °¨ Put ice in a plastic bag.   °¨ Place a towel between your skin and the bag.   °¨ Leave the ice on for 15-20 minutes, 3-4 times a day.   °· Do not use a donut-shaped pillow or sit on the toilet for long periods. This increases blood pooling and pain.   °SEEK MEDICAL CARE IF: °· You have increasing pain and swelling that is not controlled by treatment or medicine. °· You have uncontrolled bleeding. °· You have difficulty or you are unable to have a bowel movement. °· You have pain or inflammation outside the area of the hemorrhoids. °MAKE SURE YOU: °· Understand these instructions. °· Will watch your condition. °· Will get help right away   if you are not doing well or get worse. °Document Released: 06/25/2000 Document Revised: 06/14/2012 Document Reviewed: 05/02/2012 °ExitCare® Patient Information ©2015 [employer]. This information is not intended to replace advice given to you by your health care provider. Make sure you discuss any questions you have with your health care provider. ° °

## 2014-07-16 NOTE — ED Provider Notes (Signed)
CSN: [id number]     Arrival date & time 07/16/14  1117 History   First MD Initiated Contact with Patient 07/16/14 1125     Chief Complaint  Patient presents with  . Rectal Bleeding     (Consider location/radiation/quality/duration/timing/severity/associated sxs/prior Treatment) HPI Comments: 81 year old female with a history of dementia who presents with one episode of bright red blood in her stool this morning. Able to obtain much history from the patient given her dementia. Level 5 caveat applies.  However, patient does deny abdominal or rectal pain, chest pain, shortness of breath, or other associated symptoms.  Patient is a 81 y.o. female presenting with hematochezia.  Rectal Bleeding Quality:  Bright red Amount:  Unable to specify Duration: Since this morning. Timing: Once. Progression:  Resolved Context comment:  Nursing home patient Worsened by:  Defecation Associated symptoms: no abdominal pain, no light-headedness and no vomiting     History reviewed. No pertinent past medical history. History reviewed. No pertinent past surgical history. History reviewed. No pertinent family history. History  Substance Use Topics  . Smoking status: Not on file  . Smokeless tobacco: Not on file  . Alcohol Use: No   OB History    No data available     Review of Systems  Gastrointestinal: Positive for hematochezia. Negative for vomiting and abdominal pain.  Neurological: Negative for light-headedness.  All other systems reviewed and are negative.     Allergies  Dairy aid and Eggs or egg-derived products  Home Medications   Prior to Admission medications   Medication Sig Start Date End Date Taking? Authorizing Provider  acetaminophen (TYLENOL) 500 MG tablet Take 500 mg by mouth every 4 (four) hours as needed for mild pain, fever or headache.    Historical Provider, MD  alendronate (FOSAMAX) 70 MG tablet Take 70 mg by mouth once a week. On wednesdays    Historical Provider,  MD  ALPRAZolam Prudy Feeler(XANAX) 0.25 MG tablet Take 0.25 mg by mouth every 8 (eight) hours as needed for anxiety (and agitation).    Historical Provider, MD  alum & mag hydroxide-simeth (MAALOX/MYLANTA) 200-200-20 MG/5ML suspension Take 30 mLs by mouth every 6 (six) hours as needed for indigestion or heartburn (do not exceed 4 doses in 24 hours).    Historical Provider, MD  aspirin EC 81 MG tablet Take 81 mg by mouth daily with breakfast.    Historical Provider, MD  benazepril (LOTENSIN) 20 MG tablet Take 20 mg by mouth daily with breakfast.     Historical Provider, MD  Calcium Carb-Cholecalciferol (CALCIUM-VITAMIN D) 600-400 MG-UNIT TABS Take 1 tablet by mouth 2 (two) times daily.    Historical Provider, MD  cefaclor (CECLOR) 250 MG capsule Take 250 mg by mouth daily with breakfast.     Historical Provider, MD  citalopram (CELEXA) 20 MG tablet Take 20 mg by mouth daily with breakfast.     Historical Provider, MD  furosemide (LASIX) 20 MG tablet Take 20 mg by mouth daily as needed for fluid.     Historical Provider, MD  guaifenesin (ROBITUSSIN) 100 MG/5ML syrup Take 200 mg by mouth every 6 (six) hours as needed for cough.    Historical Provider, MD  latanoprost (XALATAN) 0.005 % ophthalmic solution Place 1 drop into both eyes at bedtime.    Historical Provider, MD  loperamide (IMODIUM) 2 MG capsule Take 2 mg by mouth as needed for diarrhea or loose stools (do not exceed 8 doses in 24 hours).    Historical Provider, MD  magnesium hydroxide (MILK OF MAGNESIA) 400 MG/5ML suspension Take 30 mLs by mouth daily as needed for mild constipation.    Historical Provider, MD  Memantine HCl ER (NAMENDA XR) 28 MG CP24 Take 1 capsule by mouth daily with breakfast.     Historical Provider, MD  neomycin-bacitracin-polymyxin (NEOSPORIN) ointment Apply 1 application topically as needed for wound care.     Historical Provider, MD  propranolol (INDERAL) 20 MG tablet Take 20 mg by mouth 2 (two) times daily.    Historical Provider,  MD  Vitamin D, Ergocalciferol, (DRISDOL) 50000 UNITS CAPS capsule Take 50,000 Units by mouth every 7 (seven) days. On wednesday    Historical Provider, MD   BP 169/81 mmHg  Pulse 57  Temp(Src) 98.3 F (36.8 C) (Oral)  Resp 15  SpO2 100% Physical Exam  Constitutional: She is oriented to person, place, and time. She appears well-developed and well-nourished. No distress.  Elderly  HENT:  Head: Normocephalic and atraumatic.  Mouth/Throat: Oropharynx is clear and moist.  Eyes: Conjunctivae are normal. Pupils are equal, round, and reactive to light. No scleral icterus.  Neck: Neck supple.  Cardiovascular: Normal rate, regular rhythm, normal heart sounds and intact distal pulses.   No murmur heard. Pulmonary/Chest: Effort normal and breath sounds normal. No stridor. No respiratory distress. She has no rales.  Abdominal: Soft. Bowel sounds are normal. She exhibits no distension. There is no tenderness. There is no rebound and no guarding.  Genitourinary: Rectal exam shows external hemorrhoid. Rectal exam shows no mass. Guaiac negative stool (few specks of brown stool.  no blood. ).     Musculoskeletal: Normal range of motion.  Neurological: She is alert and oriented to person, place, and time.  Skin: Skin is warm and dry. No rash noted.  Psychiatric: She has a normal mood and affect. Her behavior is normal.  Nursing note and vitals reviewed.   ED Course  Procedures (including critical care time) Labs Review Labs Reviewed  CBC WITH DIFFERENTIAL - Abnormal; Notable for the following:    WBC 3.7 (*)    Platelets 124 (*)    Eosinophils Relative 7 (*)    All other components within normal limits  COMPREHENSIVE METABOLIC PANEL - Abnormal; Notable for the following:    Creatinine, Ser 1.12 (*)    Albumin 3.4 (*)    GFR calc non Af Amer 40 (*)    GFR calc Af Amer 47 (*)    All other components within normal limits  PROTIME-INR  POC OCCULT BLOOD, ED    Imaging Review No results  found.   EKG Interpretation None      MDM   Final diagnoses:  Rectal bleeding  Hemorrhoids, unspecified hemorrhoid type    81 year old female who had one episode of bright red blood in her bowel movement was morning. Patient reports feeling well. History limited secondary to her dementia. On exam, well-appearing, abdomen soft and nontender, rectal exam shows a hemorrhoid that is not actively bleeding or thrombosed and brown stool. Stool was Hemoccult negative. Labs reassuring. Her blood pressure was elevated, but after calling her nursing facility it appears that she did not receive her antihypertensives this morning. Have ordered these here. Otherwise, patient stable for discharge.    Candyce Churn III, MD 07/16/14 [phone]

## 2014-07-16 NOTE — ED Notes (Signed)
Pt made aware to return if symptoms worsen or if any life threatening symptoms occur.   

## 2014-07-16 NOTE — ED Notes (Signed)
Per EMS: Pt from [facility], noticed bright red bleeding in stool this morning after bm.  Pt alert with hx alzheimers, heart disease.   186/92, 70hr, 96%,

## 2014-07-16 NOTE — ED Notes (Signed)
Pt from [facility], noticed bright red bleeding in stool this morning after bm.  Pt alert with hx alzheimers, heart disease. Pt not actively bleeding.

## 2014-07-16 NOTE — ED Notes (Signed)
Patient ate food brought by family prior to discharge.

## 2014-08-22 ENCOUNTER — Emergency Department (HOSPITAL_COMMUNITY): Payer: Medicare Other

## 2014-08-22 ENCOUNTER — Emergency Department (HOSPITAL_COMMUNITY)
Admission: EM | Admit: 2014-08-22 | Discharge: 2014-08-22 | Disposition: A | Discharge status: Home / Self Care | Payer: Medicare Other | Attending: Emergency Medicine | Admitting: Emergency Medicine

## 2014-08-22 ENCOUNTER — Encounter (HOSPITAL_COMMUNITY): Payer: Self-pay | Admitting: Emergency Medicine

## 2014-08-22 DIAGNOSIS — Z79899 Other long term (current) drug therapy: Secondary | ICD-10-CM | POA: Diagnosis not present

## 2014-08-22 DIAGNOSIS — I10 Essential (primary) hypertension: Secondary | ICD-10-CM | POA: Diagnosis not present

## 2014-08-22 DIAGNOSIS — W19XXXA Unspecified fall, initial encounter: Secondary | ICD-10-CM | POA: Insufficient documentation

## 2014-08-22 DIAGNOSIS — Y998 Other external cause status: Secondary | ICD-10-CM | POA: Diagnosis not present

## 2014-08-22 DIAGNOSIS — Y9389 Activity, other specified: Secondary | ICD-10-CM | POA: Diagnosis not present

## 2014-08-22 DIAGNOSIS — Y9289 Other specified places as the place of occurrence of the external cause: Secondary | ICD-10-CM | POA: Diagnosis not present

## 2014-08-22 DIAGNOSIS — G309 Alzheimer's disease, unspecified: Secondary | ICD-10-CM | POA: Insufficient documentation

## 2014-08-22 DIAGNOSIS — M810 Age-related osteoporosis without current pathological fracture: Secondary | ICD-10-CM | POA: Diagnosis not present

## 2014-08-22 DIAGNOSIS — H409 Unspecified glaucoma: Secondary | ICD-10-CM | POA: Insufficient documentation

## 2014-08-22 DIAGNOSIS — F0280 Dementia in other diseases classified elsewhere without behavioral disturbance: Secondary | ICD-10-CM | POA: Diagnosis not present

## 2014-08-22 DIAGNOSIS — Z043 Encounter for examination and observation following other accident: Secondary | ICD-10-CM | POA: Insufficient documentation

## 2014-08-22 DIAGNOSIS — Z7982 Long term (current) use of aspirin: Secondary | ICD-10-CM | POA: Insufficient documentation

## 2014-08-22 HISTORY — DX: Heart disease, unspecified: I51.9

## 2014-08-22 HISTORY — DX: Age-related osteoporosis without current pathological fracture: M81.0

## 2014-08-22 HISTORY — DX: Unspecified glaucoma: H40.9

## 2014-08-22 HISTORY — DX: Alzheimer's disease, unspecified: G30.9

## 2014-08-22 HISTORY — DX: Essential (primary) hypertension: I10

## 2014-08-22 HISTORY — DX: Dementia in other diseases classified elsewhere, unspecified severity, without behavioral disturbance, psychotic disturbance, mood disturbance, and anxiety: F02.80

## 2014-08-22 LAB — COMPREHENSIVE METABOLIC PANEL
ALT: 13 U/L (ref 0–35)
AST: 21 U/L (ref 0–37)
Albumin: 4.1 g/dL (ref 3.5–5.2)
Alkaline Phosphatase: 70 U/L (ref 39–117)
Anion gap: 6 (ref 5–15)
BUN: 26 mg/dL — ABNORMAL HIGH (ref 6–23)
CO2: 31 mmol/L (ref 19–32)
Calcium: 9.8 mg/dL (ref 8.4–10.5)
Chloride: 103 mmol/L (ref 96–112)
Creatinine, Ser: 0.98 mg/dL (ref 0.50–1.10)
GFR calc Af Amer: 55 mL/min — ABNORMAL LOW (ref >90)
GFR, EST NON AFRICAN AMERICAN: 48 mL/min — AB (ref >90)
Glucose, Bld: 92 mg/dL (ref 70–99)
Potassium: 4.2 mmol/L (ref 3.5–5.1)
SODIUM: 140 mmol/L (ref 135–145)
TOTAL PROTEIN: 7 g/dL (ref 6.0–8.3)
Total Bilirubin: 0.8 mg/dL (ref 0.3–1.2)

## 2014-08-22 LAB — CBC WITH DIFFERENTIAL/PLATELET
Basophils Absolute: 0 10*3/uL (ref 0.0–0.1)
Basophils Relative: 1 % (ref 0–1)
Eosinophils Absolute: 0.2 10*3/uL (ref 0.0–0.7)
Eosinophils Relative: 6 % — ABNORMAL HIGH (ref 0–5)
HEMATOCRIT: 43.5 % (ref 36.0–46.0)
Hemoglobin: 13.8 g/dL (ref 12.0–15.0)
LYMPHS PCT: 26 % (ref 12–46)
Lymphs Abs: 1 10*3/uL (ref 0.7–4.0)
MCH: 31.2 pg (ref 26.0–34.0)
MCHC: 31.7 g/dL (ref 30.0–36.0)
MCV: 98.4 fL (ref 78.0–100.0)
MONO ABS: 0.4 10*3/uL (ref 0.1–1.0)
Monocytes Relative: 10 % (ref 3–12)
NEUTROS ABS: 2.3 10*3/uL (ref 1.7–7.7)
Neutrophils Relative %: 57 % (ref 43–77)
Platelets: 127 10*3/uL — ABNORMAL LOW (ref 150–400)
RBC: 4.42 MIL/uL (ref 3.87–5.11)
RDW: 13.8 % (ref 11.5–15.5)
WBC: 4 10*3/uL (ref 4.0–10.5)

## 2014-08-22 LAB — TROPONIN I: Troponin I: <0.03 ng/mL (ref <0.031)

## 2014-08-22 MED ORDER — HYDRALAZINE HCL 20 MG/ML IJ SOLN
5.0000 mg | Freq: Once | INTRAMUSCULAR | Status: AC
  Administered 2014-08-22: 5 mg via INTRAVENOUS
  Filled 2014-08-22: qty 1

## 2014-08-22 NOTE — Discharge Instructions (Signed)
Hypertension Hypertension, commonly called high blood pressure, is when the force of blood pumping through your arteries is too strong. Your arteries are the blood vessels that carry blood from your heart throughout your body. A blood pressure reading consists of a higher number over a lower number, such as 110/72. The higher number (systolic) is the pressure inside your arteries when your heart pumps. The lower number (diastolic) is the pressure inside your arteries when your heart relaxes. Ideally you want your blood pressure below 120/80. Hypertension forces your heart to work harder to pump blood. Your arteries may become narrow or stiff. Having hypertension puts you at risk for heart disease, stroke, and other problems.  RISK FACTORS Some risk factors for high blood pressure are controllable. Others are not.  Risk factors you cannot control include:   Race. You may be at higher risk if you are African American.  Age. Risk increases with age.  Gender. Men are at higher risk than women before age 45 years. After age 65, women are at higher risk than men. Risk factors you can control include:  Not getting enough exercise or physical activity.  Being overweight.  Getting too much fat, sugar, calories, or salt in your diet.  Drinking too much alcohol. SIGNS AND SYMPTOMS Hypertension does not usually cause signs or symptoms. Extremely high blood pressure (hypertensive crisis) may cause headache, anxiety, shortness of breath, and nosebleed. DIAGNOSIS  To check if you have hypertension, your health care provider will measure your blood pressure while you are seated, with your arm held at the level of your heart. It should be measured at least twice using the same arm. Certain conditions can cause a difference in blood pressure between your right and left arms. A blood pressure reading that is higher than normal on one occasion does not mean that you need treatment. If one blood pressure reading  is high, ask your health care provider about having it checked again. TREATMENT  Treating high blood pressure includes making lifestyle changes and possibly taking medicine. Living a healthy lifestyle can help lower high blood pressure. You may need to change some of your habits. Lifestyle changes may include:  Following the DASH diet. This diet is high in fruits, vegetables, and whole grains. It is low in salt, red meat, and added sugars.  Getting at least 2 hours of brisk physical activity every week.  Losing weight if necessary.  Not smoking.  Limiting alcoholic beverages.  Learning ways to reduce stress. If lifestyle changes are not enough to get your blood pressure under control, your health care provider may prescribe medicine. You may need to take more than one. Work closely with your health care provider to understand the risks and benefits. HOME CARE INSTRUCTIONS  Have your blood pressure rechecked as directed by your health care provider.   Take medicines only as directed by your health care provider. Follow the directions carefully. Blood pressure medicines must be taken as prescribed. The medicine does not work as well when you skip doses. Skipping doses also puts you at risk for problems.   Do not smoke.   Monitor your blood pressure at home as directed by your health care provider. SEEK MEDICAL CARE IF:   You think you are having a reaction to medicines taken.  You have recurrent headaches or feel dizzy.  You have swelling in your ankles.  You have trouble with your vision. SEEK IMMEDIATE MEDICAL CARE IF:  You develop a severe headache or confusion.    You have unusual weakness, numbness, or feel faint.  You have severe chest or abdominal pain.  You vomit repeatedly.  You have trouble breathing. MAKE SURE YOU:   Understand these instructions.  Will watch your condition.  Will get help right away if you are not doing well or get worse. Document  Released: 06/28/2005 Document Revised: 11/12/2013 Document Reviewed: 04/20/2013 ExitCare Patient Information 2015 [employer]. This information is not intended to replace advice given to you by your health care provider. Make sure you discuss any questions you have with your health care provider. Fall Prevention and Home Safety Falls cause injuries and can affect all age groups. It is possible to use preventive measures to significantly decrease the likelihood of falls. There are many simple measures which can make your home safer and prevent falls. OUTDOORS Repair cracks and edges of walkways and driveways. Remove high doorway thresholds. Trim shrubbery on the main path into your home. Have good outside lighting. Clear walkways of tools, rocks, debris, and clutter. Check that handrails are not broken and are securely fastened. Both sides of steps should have handrails. Have leaves, snow, and ice cleared regularly. Use sand or salt on walkways during winter months. In the garage, clean up grease or oil spills. BATHROOM Install night lights. Install grab bars by the toilet and in the tub and shower. Use non-skid mats or decals in the tub or shower. Place a plastic non-slip stool in the shower to sit on, if needed. Keep floors dry and clean up all water on the floor immediately. Remove soap buildup in the tub or shower on a regular basis. Secure bath mats with non-slip, double-sided rug tape. Remove throw rugs and tripping hazards from the floors. BEDROOMS Install night lights. Make sure a bedside light is easy to reach. Do not use oversized bedding. Keep a telephone by your bedside. Have a firm chair with side arms to use for getting dressed. Remove throw rugs and tripping hazards from the floor. KITCHEN Keep handles on pots and pans turned toward the center of the stove. Use back burners when possible. Clean up spills quickly and allow time for drying. Avoid walking on wet  floors. Avoid hot utensils and knives. Position shelves so they are not too high or low. Place commonly used objects within easy reach. If necessary, use a sturdy step stool with a grab bar when reaching. Keep electrical cables out of the way. Do not use floor polish or wax that makes floors slippery. If you must use wax, use non-skid floor wax. Remove throw rugs and tripping hazards from the floor. STAIRWAYS Never leave objects on stairs. Place handrails on both sides of stairways and use them. Fix any loose handrails. Make sure handrails on both sides of the stairways are as long as the stairs. Check carpeting to make sure it is firmly attached along stairs. Make repairs to worn or loose carpet promptly. Avoid placing throw rugs at the top or bottom of stairways, or properly secure the rug with carpet tape to prevent slippage. Get rid of throw rugs, if possible. Have an electrician put in a light switch at the top and bottom of the stairs. OTHER FALL PREVENTION TIPS Wear low-heel or rubber-soled shoes that are supportive and fit well. Wear closed toe shoes. When using a stepladder, make sure it is fully opened and both spreaders are firmly locked. Do not climb a closed stepladder. Add color or contrast paint or tape to grab bars and handrails in your   home. Place contrasting color strips on first and last steps. Learn and use mobility aids as needed. Install an electrical emergency response system. Turn on lights to avoid dark areas. Replace light bulbs that burn out immediately. Get light switches that glow. Arrange furniture to create clear pathways. Keep furniture in the same place. Firmly attach carpet with non-skid or double-sided tape. Eliminate uneven floor surfaces. Select a carpet pattern that does not visually hide the edge of steps. Be aware of all pets. OTHER HOME SAFETY TIPS Set the water temperature for 120 F (48.8 C). Keep emergency numbers on or near the telephone. Keep  smoke detectors on every level of the home and near sleeping areas. Document Released: 06/18/2002 Document Revised: 12/28/2011 Document Reviewed: 09/17/2011 ExitCare Patient Information 2015 [employer]. This information is not intended to replace advice given to you by your health care provider. Make sure you discuss any questions you have with your health care provider.  

## 2014-08-22 NOTE — ED Notes (Signed)
Bed: [id number] Expected date:  Expected time:  Means of arrival:  Comments: Ems, pt found on floor

## 2014-08-22 NOTE — ED Notes (Addendum)
Per EMS: [facility] not sure if fall occurred. No pain, no deformity, no injury noted. Pt does have hx of unsteady gait, walks with walker.

## 2014-08-22 NOTE — ED Provider Notes (Signed)
CSN: [id number]     Arrival date & time 08/22/14  1048 History   First MD Initiated Contact with Patient 08/22/14 1049     Chief Complaint  Patient presents with  . found on floor      (Consider location/radiation/quality/duration/timing/severity/associated sxs/prior Treatment) HPI Comments: Patient presents to the ER for evaluation after possible fall. Patient was found on the floor by staff. There was no witnessed fall. Patient does have a baseline dementia, cannot provide any information.  Upon arrival to the ER, patient denies pain. She is found to be hypertensive. She does have a history of hypertension.   Level V Caveat due to Dementia.   Past Medical History  Diagnosis Date  . Hypertension   . Alzheimer disease   . Osteoporosis   . Heart disease   . Glaucoma    History reviewed. No pertinent past surgical history. No family history on file. History  Substance Use Topics  . Smoking status: Not on file  . Smokeless tobacco: Not on file  . Alcohol Use: No   OB History    No data available     Review of Systems  Unable to perform ROS: Dementia      Allergies  Dairy aid and Eggs or egg-derived products  Home Medications   Prior to Admission medications   Medication Sig Start Date End Date Taking? Authorizing Provider  acetaminophen (TYLENOL) 500 MG tablet Take 500 mg by mouth every 4 (four) hours as needed for mild pain, fever or headache.   Yes Historical Provider, MD  alendronate (FOSAMAX) 70 MG tablet Take 70 mg by mouth once a week. On wednesdays   Yes Historical Provider, MD  ALPRAZolam Prudy Feeler) 0.25 MG tablet Take 0.25 mg by mouth daily. And every 8 hours as needed for anxiety   Yes Historical Provider, MD  alum & mag hydroxide-simeth (MAALOX/MYLANTA) 200-200-20 MG/5ML suspension Take 30 mLs by mouth every 6 (six) hours as needed for indigestion or heartburn (do not exceed 4 doses in 24 hours).   Yes Historical Provider, MD  aspirin EC 81 MG tablet Take 81  mg by mouth daily with breakfast.   Yes Historical Provider, MD  benazepril (LOTENSIN) 20 MG tablet Take 20 mg by mouth daily with breakfast.    Yes Historical Provider, MD  Calcium Carb-Cholecalciferol (CALCIUM-VITAMIN D) 600-400 MG-UNIT TABS Take 1 tablet by mouth 2 (two) times daily.   Yes Historical Provider, MD  citalopram (CELEXA) 20 MG tablet Take 20 mg by mouth daily with breakfast.    Yes Historical Provider, MD  guaifenesin (ROBITUSSIN) 100 MG/5ML syrup Take 200 mg by mouth every 6 (six) hours as needed for cough.   Yes Historical Provider, MD  latanoprost (XALATAN) 0.005 % ophthalmic solution Place 1 drop into both eyes at bedtime.   Yes Historical Provider, MD  loperamide (IMODIUM) 2 MG capsule Take 2 mg by mouth as needed for diarrhea or loose stools (do not exceed 8 doses in 24 hours).   Yes Historical Provider, MD  magnesium hydroxide (MILK OF MAGNESIA) 400 MG/5ML suspension Take 30 mLs by mouth daily as needed for mild constipation.   Yes Historical Provider, MD  Memantine HCl ER (NAMENDA XR) 28 MG CP24 Take 1 capsule by mouth daily with breakfast.    Yes Historical Provider, MD  neomycin-bacitracin-polymyxin (NEOSPORIN) ointment Apply 1 application topically as needed for wound care.    Yes Historical Provider, MD  Nutritional Supplements (NUTRITIONAL DRINK PO) Take 1 Container by mouth 3 (three)  times daily with meals. CAN NOT BE MILK BASED   Yes Historical Provider, MD  propranolol (INDERAL) 20 MG tablet Take 20 mg by mouth 2 (two) times daily.   Yes Historical Provider, MD  Vitamin D, Ergocalciferol, (DRISDOL) 50000 UNITS CAPS capsule Take 50,000 Units by mouth every 7 (seven) days. On wednesday   Yes Historical Provider, MD  Jeanann Lewandowsky (PREPARATION H EX) Apply 1 application topically 4 (four) times daily as needed (for itching or Hemorrhoids pain).   Yes Historical Provider, MD   BP 175/71 mmHg  Pulse 54  Temp(Src) 97.5 F (36.4 C) (Oral)  Resp 20  SpO2 98% Physical Exam   Constitutional: She appears well-developed and well-nourished. No distress.  HENT:  Head: Normocephalic and atraumatic.  Right Ear: Hearing normal.  Left Ear: Hearing normal.  Nose: Nose normal.  Mouth/Throat: Oropharynx is clear and moist and mucous membranes are normal.  Eyes: Conjunctivae and EOM are normal. Pupils are equal, round, and reactive to light.  Neck: Normal range of motion. Neck supple.  Cardiovascular: Regular rhythm, S1 normal and S2 normal.  Exam reveals no gallop and no friction rub.   No murmur heard. Pulmonary/Chest: Effort normal and breath sounds normal. No respiratory distress. She exhibits no tenderness.  Abdominal: Soft. Normal appearance and bowel sounds are normal. There is no hepatosplenomegaly. There is no tenderness. There is no rebound, no guarding, no tenderness at McBurney's point and negative Murphy's sign. No hernia.  Musculoskeletal: Normal range of motion.  Neurological: She is alert. She has normal strength. She is disoriented. No cranial nerve deficit or sensory deficit. Coordination normal. GCS eye subscore is 4. GCS verbal subscore is 4. GCS motor subscore is 6.  Skin: Skin is warm, dry and intact. No rash noted. No cyanosis.  Psychiatric: She has a normal mood and affect. Her speech is normal and behavior is normal. Thought content normal. Cognition and memory are impaired.  Nursing note and vitals reviewed.   ED Course  Procedures (including critical care time) Labs Review Labs Reviewed  CBC WITH DIFFERENTIAL/PLATELET - Abnormal; Notable for the following:    Platelets 127 (*)    Eosinophils Relative 6 (*)    All other components within normal limits  COMPREHENSIVE METABOLIC PANEL - Abnormal; Notable for the following:    BUN 26 (*)    GFR calc non Af Amer 48 (*)    GFR calc Af Amer 55 (*)    All other components within normal limits  TROPONIN I    Imaging Review Dg Chest 2 View  08/22/2014   CLINICAL DATA:  Patient found on floor of  nursing home  EXAM: CHEST  2 VIEW  COMPARISON:  None.  FINDINGS: There is a calcified granuloma in the left base. There is no edema or consolidation. Heart is upper normal in size with pulmonary vascularity within normal limits. Prominence in the right peritracheal region most likely represents great vessel prominence in this age group. There is calcification in each carotid artery. No adenopathy. No pneumothorax. Bones are osteoporotic. There is degenerative change in the thoracic spine.  IMPRESSION: Calcified granuloma left base. No edema or consolidation. Extensive atherosclerotic calcification.   Electronically Signed   By: Bretta Bang III M.D.   On: 08/22/2014 11:31   Ct Head Wo Contrast  08/22/2014   CLINICAL DATA:  Per EMS: pt was found on floor at [facility] house, not sure if fall occurred. No pain, no deformity, no injury noted. Pt does have hx of  unsteady gait, walks with walker.  EXAM: CT HEAD WITHOUT CONTRAST  CT CERVICAL SPINE WITHOUT CONTRAST  TECHNIQUE: Multidetector CT imaging of the head and cervical spine was performed following the standard protocol without intravenous contrast. Multiplanar CT image reconstructions of the cervical spine were also generated.  COMPARISON:  None.  FINDINGS: CT HEAD FINDINGS  Ventricles are normal in configuration. There is ventricular and sulcal enlargement reflecting age related volume loss. No hydrocephalus.  There are no parenchymal masses or mass effect. There is no evidence of a recent cortical infarct. 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 Patchy periventricular white matter hypoattenuation is also noted consistent with chronic microvascular ischemic change, mild-to-moderate.  There are no extra-axial masses or abnormal fluid collections.  There is no intracranial hemorrhage.  No skull fracture. Visualized sinuses and mastoid air cells are clear.  CT CERVICAL SPINE FINDINGS  No fracture. Grade 1 anterolisthesis  of C4 on C5, degenerative in origin. There is marked loss of disc height at C5-C6 and C6-C7 with endplate sclerosis and irregularity as well as endplate spurring. Mild loss of disc height at C3-C4-C4-C5. Are bilateral facet degenerative changes and varying degrees of neural foraminal narrowing greatest on the left at C5-C6, moderate. No disc herniation is seen.  Bones are diffusely demineralized.  There are dense carotid vascular calcifications and calcifications along the palatine tonsils. Soft tissues otherwise unremarkable. Lung apices are clear.  IMPRESSION: HEAD CT:  No acute intracranial abnormalities.  CERVICAL CT:  No fracture or acute finding.   Electronically Signed   By: Amie Portlandavid  Ormond M.D.   On: 08/22/2014 13:51   Ct Cervical Spine Wo Contrast  08/22/2014   CLINICAL DATA:  Per EMS[facility] not sure if fall occurred. No pain, no deformity, no injury noted. Pt does have hx of unsteady gait, walks with walker.  EXAM: CT HEAD WITHOUT CONTRAST  CT CERVICAL SPINE WITHOUT CONTRAST  TECHNIQUE: Multidetector CT imaging of the head and cervical spine was performed following the standard protocol without intravenous contrast. Multiplanar CT image reconstructions of the cervical spine were also generated.  COMPARISON:  None.  FINDINGS: CT HEAD FINDINGS  Ventricles are normal in configuration. There is ventricular and sulcal enlargement reflecting age related volume loss. No hydrocephalus.  There are no parenchymal masses or mass effect. There is no evidence of a recent cortical infarct. 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 Patchy periventricular white matter hypoattenuation is also noted consistent with chronic microvascular ischemic change, mild-to-moderate.  There are no extra-axial masses or abnormal fluid collections.  There is no intracranial hemorrhage.  No skull fracture. Visualized sinuses and mastoid air cells are clear.  CT  CERVICAL SPINE FINDINGS  No fracture. Grade 1 anterolisthesis of C4 on C5, degenerative in origin. There is marked loss of disc height at C5-C6 and C6-C7 with endplate sclerosis and irregularity as well as endplate spurring. Mild loss of disc height at C3-C4-C4-C5. Are bilateral facet degenerative changes and varying degrees of neural foraminal narrowing greatest on the left at C5-C6, moderate. No disc herniation is seen.  Bones are diffusely demineralized.  There are dense carotid vascular calcifications and calcifications along the palatine tonsils. Soft tissues otherwise unremarkable. Lung apices are clear.  IMPRESSION: HEAD CT:  No acute intracranial abnormalities.  CERVICAL CT:  No fracture or acute finding.   Electronically Signed   By: Amie Portlandavid  Ormond M.D.   On: 08/22/2014  13:51     EKG Interpretation   Date/Time:  Thursday August 22 2014 10:54:37 EST Ventricular Rate:  48 PR Interval:  196 QRS Duration: 84 QT Interval:  542 QTC Calculation: 484 R Axis:   32 Text Interpretation:  Sinus bradycardia Low voltage, extremity and  precordial leads Probable anteroseptal infarct, old No significant change  since last tracing Confirmed by Janara Klett  MD, Nika Yazzie [phone]) on  08/22/2014 10:58:11 AM      MDM   Final diagnoses:  Fall  Essential hypertension    Patient presented to the ER for evaluation after presumed fall. Patient was found on the floor at [facility]. There was no witnessed fall. Patient does have dementia, but arrived without complaints. There did not appear to be any outward signs of injury. Patient is ambulating here in the ER without complaint. No concern for hip or other extremity injury. She has no tenderness over the midline thoracic or lumbosacral spine. CT head and cervical spine were performed because of presumed fall. No acute injury is noted.  Patient was found to be hypertensive at arrival. She does have a history of hypertension. M ER revealed that she did  receive her morning meds. She was given hydralazine here in the ER with some improvement. She does report that her blood pressure is "always high". She is asymptomatic. Cardiac workup is negative. Will return and continue current medications, follow-up with primary doctor.    Gilda Crease, MD 08/22/14 [phone]

## 2014-08-22 NOTE — ED Notes (Signed)
Patient transported to X-ray 

## 2014-08-22 NOTE — ED Notes (Signed)
Patient transported to CT 

## 2014-09-23 ENCOUNTER — Emergency Department (HOSPITAL_COMMUNITY)
Admission: EM | Admit: 2014-09-23 | Discharge: 2014-09-23 | Disposition: A | Discharge status: Home / Self Care | Payer: Medicare Other | Attending: Emergency Medicine | Admitting: Emergency Medicine

## 2014-09-23 ENCOUNTER — Emergency Department (HOSPITAL_COMMUNITY): Payer: Medicare Other

## 2014-09-23 ENCOUNTER — Encounter (HOSPITAL_COMMUNITY): Payer: Self-pay | Admitting: Emergency Medicine

## 2014-09-23 DIAGNOSIS — W07XXXA Fall from chair, initial encounter: Secondary | ICD-10-CM | POA: Diagnosis not present

## 2014-09-23 DIAGNOSIS — Y9389 Activity, other specified: Secondary | ICD-10-CM | POA: Insufficient documentation

## 2014-09-23 DIAGNOSIS — H409 Unspecified glaucoma: Secondary | ICD-10-CM | POA: Insufficient documentation

## 2014-09-23 DIAGNOSIS — Z7982 Long term (current) use of aspirin: Secondary | ICD-10-CM | POA: Insufficient documentation

## 2014-09-23 DIAGNOSIS — Z23 Encounter for immunization: Secondary | ICD-10-CM | POA: Insufficient documentation

## 2014-09-23 DIAGNOSIS — Y998 Other external cause status: Secondary | ICD-10-CM | POA: Insufficient documentation

## 2014-09-23 DIAGNOSIS — I10 Essential (primary) hypertension: Secondary | ICD-10-CM | POA: Insufficient documentation

## 2014-09-23 DIAGNOSIS — Z79899 Other long term (current) drug therapy: Secondary | ICD-10-CM | POA: Insufficient documentation

## 2014-09-23 DIAGNOSIS — Z043 Encounter for examination and observation following other accident: Secondary | ICD-10-CM | POA: Insufficient documentation

## 2014-09-23 DIAGNOSIS — M810 Age-related osteoporosis without current pathological fracture: Secondary | ICD-10-CM | POA: Diagnosis not present

## 2014-09-23 DIAGNOSIS — Y92128 Other place in nursing home as the place of occurrence of the external cause: Secondary | ICD-10-CM | POA: Insufficient documentation

## 2014-09-23 DIAGNOSIS — W19XXXA Unspecified fall, initial encounter: Secondary | ICD-10-CM

## 2014-09-23 DIAGNOSIS — G309 Alzheimer's disease, unspecified: Secondary | ICD-10-CM | POA: Diagnosis not present

## 2014-09-23 MED ORDER — TETANUS-DIPHTH-ACELL PERTUSSIS 5-2.5-18.5 LF-MCG/0.5 IM SUSP
0.5000 mL | Freq: Once | INTRAMUSCULAR | Status: AC
  Administered 2014-09-23: 0.5 mL via INTRAMUSCULAR
  Filled 2014-09-23: qty 0.5

## 2014-09-23 NOTE — Discharge Instructions (Signed)
Follow up with your md in one week for recheck °

## 2014-09-23 NOTE — ED Notes (Signed)
Per EmS: pt fell side ways out of chair falling onto face. Pt has small scratch to right ring finger. No deformities noted, no LOC. Pt denies complaint.

## 2014-09-23 NOTE — ED Notes (Signed)
[facility] called for transport back to facility 

## 2014-09-23 NOTE — ED Notes (Signed)
Misty StanleyLisa [facility] Diet[facility]rogram [facility] report.

## 2014-09-23 NOTE — ED Notes (Signed)
Bed: WA13 Expected date:  Expected time:  Means of arrival:  Comments: EMS 

## 2014-09-23 NOTE — Progress Notes (Signed)
CSW met with pt at bedside. There was no family present. Per note pt is from [facility] and fell sideways out of her chair today, and fell on her face.  Pt appeared to be slightly confused during interview. Once CSW asked about fall, pt stated " I guess I did." Ill soon be 81 years old. They just cant expect me to come to the hospital when I fall." However, the pt is 80 years old.  Pt states she does not know how she fell. She stated " I dont know. Im just an old lady. That's what happens when you get older."  Pt informed CSW that she was hungry. CSW made nurse aware, who stated it was okay if pt was given food.   CSW brought pt food. However, upon arrival pt stated that the sandwich was too cold and that she did not want it.  Willette Brace [phone] ED CSW 09/23/2014 5:31 PM

## 2014-09-24 NOTE — ED Provider Notes (Signed)
CSN: [id number]     Arrival date & time 09/23/14  1213 History   First MD Initiated Contact with Patient 09/23/14 1238     Chief Complaint  Patient presents with  . Fall     (Consider location/radiation/quality/duration/timing/severity/associated sxs/prior Treatment) Patient is a 81 y.o. female presenting with fall. The history is provided by the nursing home (the pt fell and hit her head.  no loc).  Fall This is a recurrent problem. The current episode started 6 to 12 hours ago. The problem occurs every several days. The problem has not changed since onset.Pertinent negatives include no chest pain, no abdominal pain and no headaches. Nothing aggravates the symptoms. Nothing relieves the symptoms.    Past Medical History  Diagnosis Date  . Hypertension   . Alzheimer disease   . Osteoporosis   . Heart disease   . Glaucoma    History reviewed. No pertinent past surgical history. No family history on file. History  Substance Use Topics  . Smoking status: Not on file  . Smokeless tobacco: Not on file  . Alcohol Use: No   OB History    No data available     Review of Systems  Constitutional: Negative for appetite change and fatigue.  HENT: Negative for congestion, ear discharge and sinus pressure.   Eyes: Negative for discharge.  Respiratory: Negative for cough.   Cardiovascular: Negative for chest pain.  Gastrointestinal: Negative for abdominal pain and diarrhea.  Genitourinary: Negative for frequency and hematuria.  Musculoskeletal: Negative for back pain.  Skin: Negative for rash.  Neurological: Negative for seizures and headaches.  Psychiatric/Behavioral: Negative for hallucinations.      Allergies  Dairy aid and Eggs or egg-derived products  Home Medications   Prior to Admission medications   Medication Sig Start Date End Date Taking? Authorizing Provider  acetaminophen (TYLENOL) 500 MG tablet Take 500 mg by mouth every 4 (four) hours as needed for mild pain,  fever or headache.   Yes Historical Provider, MD  alendronate (FOSAMAX) 70 MG tablet Take 70 mg by mouth every Wednesday.    Yes Historical Provider, MD  ALPRAZolam (XANAX) 0.25 MG tablet Take 0.25 mg by mouth daily. And every 8 hours as needed for anxiety   Yes Historical Provider, MD  alum & mag hydroxide-simeth (MAALOX/MYLANTA) 200-200-20 MG/5ML suspension Take 30 mLs by mouth every 6 (six) hours as needed for indigestion or heartburn (do not exceed 4 doses in 24 hours).   Yes Historical Provider, MD  aspirin EC 81 MG tablet Take 81 mg by mouth daily with breakfast.   Yes Historical Provider, MD  benazepril (LOTENSIN) 20 MG tablet Take 20 mg by mouth daily with breakfast.    Yes Historical Provider, MD  Calcium Carb-Cholecalciferol (CALCIUM-VITAMIN D) 600-400 MG-UNIT TABS Take 1 tablet by mouth 2 (two) times daily.   Yes Historical Provider, MD  citalopram (CELEXA) 20 MG tablet Take 20 mg by mouth daily with breakfast.    Yes Historical Provider, MD  guaifenesin (ROBITUSSIN) 100 MG/5ML syrup Take 200 mg by mouth every 6 (six) hours as needed for cough.   Yes Historical Provider, MD  latanoprost (XALATAN) 0.005 % ophthalmic solution Place 1 drop into both eyes at bedtime.   Yes Historical Provider, MD  loperamide (IMODIUM) 2 MG capsule Take 2 mg by mouth as needed for diarrhea or loose stools (do not exceed 8 doses in 24 hours).   Yes Historical Provider, MD  magnesium hydroxide (MILK OF MAGNESIA) 400 MG/5ML suspension  Take 30 mLs by mouth daily as needed for mild constipation.   Yes Historical Provider, MD  Memantine HCl ER (NAMENDA XR) 28 MG CP24 Take 1 capsule by mouth daily with breakfast.    Yes Historical Provider, MD  neomycin-bacitracin-polymyxin (NEOSPORIN) ointment Apply 1 application topically as needed for wound care.    Yes Historical Provider, MD  Nutritional Supplements (NUTRITIONAL DRINK PO) Take 1 Container by mouth 3 (three) times daily with meals. CAN NOT BE MILK BASED   Yes  Historical Provider, MD  propranolol (INDERAL) 20 MG tablet Take 20 mg by mouth 2 (two) times daily.   Yes Historical Provider, MD  Vitamin D, Ergocalciferol, (DRISDOL) 50000 UNITS CAPS capsule Take 50,000 Units by mouth every Wednesday.    Yes Historical Provider, MD  Witch Hazel (PREPARATION H EX) Apply 1 application topically 4 (four) times daily as needed (for itching or Hemorrhoids pain).   Yes Historical Provider, MD   BP 162/66 mmHg  Pulse 58  Temp(Src) 98 F (36.7 C) (Oral)  Resp 18  SpO2 94% Physical Exam  Constitutional: She appears well-developed.  HENT:  Head: Normocephalic.  Eyes: Conjunctivae and EOM are normal. No scleral icterus.  Neck: Neck supple. No thyromegaly present.  Cardiovascular: Normal rate and regular rhythm.  Exam reveals no gallop and no friction rub.   No murmur heard. Pulmonary/Chest: No stridor. She has no wheezes. She has no rales. She exhibits no tenderness.  Abdominal: She exhibits no distension. There is no tenderness. There is no rebound.  Musculoskeletal: Normal range of motion. She exhibits no edema.  Lymphadenopathy:    She has no cervical adenopathy.  Neurological: She exhibits normal muscle tone. Coordination normal.  Pt alert only oriented to person  Skin: No rash noted. No erythema.  Psychiatric: She has a normal mood and affect.    ED Course  Procedures (including critical care time) Labs Review Labs Reviewed - No data to display  Imaging Review Ct Head Wo Contrast  09/23/2014   CLINICAL DATA:  Fall  EXAM: CT HEAD WITHOUT CONTRAST  CT CERVICAL SPINE WITHOUT CONTRAST  TECHNIQUE: Multidetector CT imaging of the head and cervical spine was performed following the standard protocol without intravenous contrast. Multiplanar CT image reconstructions of the cervical spine were also generated.  COMPARISON:  08/22/2014  FINDINGS: CT HEAD FINDINGS  Global atrophy. Chronic ischemic changes. No mass effect, midline shift, or acute hemorrhage. No  skull fracture. Mastoid air cells are clear.  CT CERVICAL SPINE FINDINGS  No acute fracture.  No dislocation.  There is reversal of lordosis in the mid cervical spine. Anterolisthesis at C4 upon C5 and C3 upon C4 is related to facet arthropathy and disc narrowing.  There is severe narrowing of the C5-6 and C6-7 discs with uncovertebral osteophytes. There is no amount of spinal stenosis at these levels.  No obvious soft tissue injury or spinal hematoma.  IMPRESSION: No evidence of injury within the cranium or cervical spine. Chronic changes are noted   Electronically Signed   By: Jolaine Click M.D.   On: 09/23/2014 14:25   Ct Cervical Spine Wo Contrast  09/23/2014   CLINICAL DATA:  Fall  EXAM: CT HEAD WITHOUT CONTRAST  CT CERVICAL SPINE WITHOUT CONTRAST  TECHNIQUE: Multidetector CT imaging of the head and cervical spine was performed following the standard protocol without intravenous contrast. Multiplanar CT image reconstructions of the cervical spine were also generated.  COMPARISON:  08/22/2014  FINDINGS: CT HEAD FINDINGS  Global atrophy. Chronic ischemic  changes. No mass effect, midline shift, or acute hemorrhage. No skull fracture. Mastoid air cells are clear.  CT CERVICAL SPINE FINDINGS  No acute fracture.  No dislocation.  There is reversal of lordosis in the mid cervical spine. Anterolisthesis at C4 upon C5 and C3 upon C4 is related to facet arthropathy and disc narrowing.  There is severe narrowing of the C5-6 and C6-7 discs with uncovertebral osteophytes. There is no amount of spinal stenosis at these levels.  No obvious soft tissue injury or spinal hematoma.  IMPRESSION: No evidence of injury within the cranium or cervical spine. Chronic changes are noted   Electronically Signed   By: Jolaine ClickArthur  Hoss M.D.   On: 09/23/2014 14:25     EKG Interpretation None      MDM   Final diagnoses:  Fall, initial encounter       Bethann BerkshireJoseph Kimerly Rowand, MD 09/24/14 [phone]

## 2014-11-17 ENCOUNTER — Emergency Department (HOSPITAL_COMMUNITY)
Admission: EM | Admit: 2014-11-17 | Discharge: 2014-11-17 | Disposition: A | Discharge status: Home / Self Care | Payer: Medicare Other | Attending: Emergency Medicine | Admitting: Emergency Medicine

## 2014-11-17 ENCOUNTER — Encounter (HOSPITAL_COMMUNITY): Payer: Self-pay

## 2014-11-17 DIAGNOSIS — Y998 Other external cause status: Secondary | ICD-10-CM | POA: Diagnosis not present

## 2014-11-17 DIAGNOSIS — W01198A Fall on same level from slipping, tripping and stumbling with subsequent striking against other object, initial encounter: Secondary | ICD-10-CM | POA: Diagnosis not present

## 2014-11-17 DIAGNOSIS — W19XXXA Unspecified fall, initial encounter: Secondary | ICD-10-CM

## 2014-11-17 DIAGNOSIS — I10 Essential (primary) hypertension: Secondary | ICD-10-CM | POA: Insufficient documentation

## 2014-11-17 DIAGNOSIS — G309 Alzheimer's disease, unspecified: Secondary | ICD-10-CM | POA: Diagnosis not present

## 2014-11-17 DIAGNOSIS — Z7982 Long term (current) use of aspirin: Secondary | ICD-10-CM | POA: Insufficient documentation

## 2014-11-17 DIAGNOSIS — H409 Unspecified glaucoma: Secondary | ICD-10-CM | POA: Insufficient documentation

## 2014-11-17 DIAGNOSIS — Z043 Encounter for examination and observation following other accident: Secondary | ICD-10-CM | POA: Diagnosis not present

## 2014-11-17 DIAGNOSIS — Z79899 Other long term (current) drug therapy: Secondary | ICD-10-CM | POA: Insufficient documentation

## 2014-11-17 DIAGNOSIS — M810 Age-related osteoporosis without current pathological fracture: Secondary | ICD-10-CM | POA: Diagnosis not present

## 2014-11-17 DIAGNOSIS — Y92128 Other place in nursing home as the place of occurrence of the external cause: Secondary | ICD-10-CM | POA: Diagnosis not present

## 2014-11-17 DIAGNOSIS — Y9389 Activity, other specified: Secondary | ICD-10-CM | POA: Insufficient documentation

## 2014-11-17 NOTE — Discharge Instructions (Signed)

## 2014-11-17 NOTE — ED Provider Notes (Signed)
CSN: [id number]     Arrival date & time 11/17/14  1041 History   First MD Initiated Contact with Patient 11/17/14 1042     Chief Complaint  Patient presents with  . Fall     (Consider location/radiation/quality/duration/timing/severity/associated sxs/prior Treatment) HPI Comments: Patient here after a witnessed fall and striking her head prior to arrival. Denies any loss of consciousness. Denies any headache at this time. No vomiting. Her mental status is at baseline. Denies any back or neck pain. No abdominal chest pain. No recent illnesses. EMS placed the patient between 2 towels and a test her head to the pillow with tape and transported the patient  Patient is a 81 y.o. female presenting with fall. The history is provided by the patient.  Fall    Past Medical History  Diagnosis Date  . Hypertension   . Alzheimer disease   . Osteoporosis   . Heart disease   . Glaucoma    No past surgical history on file. No family history on file. History  Substance Use Topics  . Smoking status: Not on file  . Smokeless tobacco: Not on file  . Alcohol Use: No   OB History    No data available     Review of Systems  All other systems reviewed and are negative.     Allergies  Dairy aid and Eggs or egg-derived products  Home Medications   Prior to Admission medications   Medication Sig Start Date End Date Taking? Authorizing Provider  alendronate (FOSAMAX) 70 MG tablet Take 70 mg by mouth every Wednesday.    Yes Historical Provider, MD  ALPRAZolam (XANAX) 0.25 MG tablet Take 0.25 mg by mouth daily with breakfast.    Yes Historical Provider, MD  ALPRAZolam (XANAX) 0.25 MG tablet Take 0.25 mg by mouth every 8 (eight) hours as needed for anxiety.   Yes Historical Provider, MD  aspirin EC 81 MG tablet Take 81 mg by mouth daily with breakfast.   Yes Historical Provider, MD  benazepril (LOTENSIN) 20 MG tablet Take 20 mg by mouth daily with breakfast.    Yes Historical Provider, MD   Calcium Carb-Cholecalciferol (CALCIUM-VITAMIN D) 600-400 MG-UNIT TABS Take 1 tablet by mouth 2 (two) times daily.   Yes Historical Provider, MD  acetaminophen (TYLENOL) 500 MG tablet Take 500 mg by mouth every 4 (four) hours as needed for mild pain, fever or headache.    Historical Provider, MD  alum & mag hydroxide-simeth (MAALOX/MYLANTA) 200-200-20 MG/5ML suspension Take 30 mLs by mouth every 6 (six) hours as needed for indigestion or heartburn (do not exceed 4 doses in 24 hours).    Historical Provider, MD  citalopram (CELEXA) 20 MG tablet Take 20 mg by mouth daily with breakfast.     Historical Provider, MD  guaifenesin (ROBITUSSIN) 100 MG/5ML syrup Take 200 mg by mouth every 6 (six) hours as needed for cough.    Historical Provider, MD  latanoprost (XALATAN) 0.005 % ophthalmic solution Place 1 drop into both eyes at bedtime.    Historical Provider, MD  loperamide (IMODIUM) 2 MG capsule Take 2 mg by mouth as needed for diarrhea or loose stools (do not exceed 8 doses in 24 hours).    Historical Provider, MD  magnesium hydroxide (MILK OF MAGNESIA) 400 MG/5ML suspension Take 30 mLs by mouth daily as needed for mild constipation.    Historical Provider, MD  Memantine HCl ER (NAMENDA XR) 28 MG CP24 Take 1 capsule by mouth daily with breakfast.  Historical Provider, MD  neomycin-bacitracin-polymyxin (NEOSPORIN) ointment Apply 1 application topically as needed for wound care.     Historical Provider, MD  Nutritional Supplements (NUTRITIONAL DRINK PO) Take 1 Container by mouth 3 (three) times daily with meals. CAN NOT BE MILK BASED    Historical Provider, MD  propranolol (INDERAL) 20 MG tablet Take 20 mg by mouth 2 (two) times daily.    Historical Provider, MD  Vitamin D, Ergocalciferol, (DRISDOL) 50000 UNITS CAPS capsule Take 50,000 Units by mouth every Wednesday.     Historical Provider, MD  Jeanann LewandowskyWitch Hazel (PREPARATION H EX) Apply 1 application topically 4 (four) times daily as needed (for itching or  Hemorrhoids pain).    Historical Provider, MD   BP 181/75 mmHg  Pulse 57  Temp(Src) 98 F (36.7 C) (Oral)  Resp 16  SpO2 97% Physical Exam  Constitutional: She is oriented to person, place, and time. She appears well-developed and well-nourished.  Non-toxic appearance. No distress.  HENT:  Head: Normocephalic and atraumatic.  Eyes: Conjunctivae, EOM and lids are normal. Pupils are equal, round, and reactive to light.  Neck: Normal range of motion. Neck supple. No spinous process tenderness and no muscular tenderness present. No tracheal deviation present. No thyroid mass present.  Cardiovascular: Normal rate, regular rhythm and normal heart sounds.  Exam reveals no gallop.   No murmur heard. Pulmonary/Chest: Effort normal and breath sounds normal. No stridor. No respiratory distress. She has no decreased breath sounds. She has no wheezes. She has no rhonchi. She has no rales.  Abdominal: Soft. Normal appearance and bowel sounds are normal. She exhibits no distension. There is no tenderness. There is no rebound and no CVA tenderness.  Musculoskeletal: Normal range of motion. She exhibits no edema or tenderness.  Neurological: She is alert and oriented to person, place, and time. She has normal strength. No cranial nerve deficit or sensory deficit. GCS eye subscore is 4. GCS verbal subscore is 5. GCS motor subscore is 6.  Skin: Skin is warm and dry. No abrasion and no rash noted.  Psychiatric: She has a normal mood and affect. Her speech is normal and behavior is normal.  Nursing note and vitals reviewed.   ED Course  Procedures (including critical care time) Labs Review Labs Reviewed - No data to display  Imaging Review No results found.   EKG Interpretation None      MDM   Final diagnoses:  Fall, initial encounter    Patient without evidence of trauma at this time. Full range of motion at all extremities. No indication for emergent imaging is stable for  discharge    Lorre NickAnthony Lucy Boardman, MD 11/17/14 1058

## 2014-11-17 NOTE — ED Notes (Signed)
No s/s of trauma

## 2014-11-17 NOTE — ED Notes (Signed)
DNR YELLOW COPY GIVEN TO PTAR 

## 2014-11-17 NOTE — ED Notes (Signed)
[facility]-Per EMS pt fell onto hardwoods this am hitting her head. Pt sts that she only hit her "rear-end". Pt denies injury or pain. Pt sent by facility per protocol.  Pt is A&O x3 and has hx of Alzheimer's. Pt in NAD.

## 2014-11-17 NOTE — ED Notes (Signed)
MD at bedside. 

## 2014-11-17 NOTE — ED Notes (Signed)
Bed: WA09 Expected date:  Expected time:  Means of arrival:  Comments: EMS FALL 

## 2015-02-16 ENCOUNTER — Encounter (HOSPITAL_COMMUNITY): Payer: Self-pay | Admitting: *Deleted

## 2015-02-16 ENCOUNTER — Emergency Department (HOSPITAL_COMMUNITY)
Admission: EM | Admit: 2015-02-16 | Discharge: 2015-02-16 | Disposition: A | Discharge status: Skilled Nursing Facility | Payer: Medicare Other | Attending: Emergency Medicine | Admitting: Emergency Medicine

## 2015-02-16 ENCOUNTER — Emergency Department (HOSPITAL_COMMUNITY): Payer: Medicare Other

## 2015-02-16 DIAGNOSIS — H409 Unspecified glaucoma: Secondary | ICD-10-CM | POA: Insufficient documentation

## 2015-02-16 DIAGNOSIS — Y9301 Activity, walking, marching and hiking: Secondary | ICD-10-CM | POA: Insufficient documentation

## 2015-02-16 DIAGNOSIS — F0280 Dementia in other diseases classified elsewhere without behavioral disturbance: Secondary | ICD-10-CM | POA: Diagnosis not present

## 2015-02-16 DIAGNOSIS — M810 Age-related osteoporosis without current pathological fracture: Secondary | ICD-10-CM | POA: Insufficient documentation

## 2015-02-16 DIAGNOSIS — G309 Alzheimer's disease, unspecified: Secondary | ICD-10-CM | POA: Diagnosis not present

## 2015-02-16 DIAGNOSIS — W1839XA Other fall on same level, initial encounter: Secondary | ICD-10-CM | POA: Insufficient documentation

## 2015-02-16 DIAGNOSIS — R52 Pain, unspecified: Secondary | ICD-10-CM

## 2015-02-16 DIAGNOSIS — Z7982 Long term (current) use of aspirin: Secondary | ICD-10-CM | POA: Insufficient documentation

## 2015-02-16 DIAGNOSIS — Y92129 Unspecified place in nursing home as the place of occurrence of the external cause: Secondary | ICD-10-CM | POA: Insufficient documentation

## 2015-02-16 DIAGNOSIS — Z79899 Other long term (current) drug therapy: Secondary | ICD-10-CM | POA: Diagnosis not present

## 2015-02-16 DIAGNOSIS — S61412A Laceration without foreign body of left hand, initial encounter: Secondary | ICD-10-CM

## 2015-02-16 DIAGNOSIS — Z87891 Personal history of nicotine dependence: Secondary | ICD-10-CM | POA: Insufficient documentation

## 2015-02-16 DIAGNOSIS — S61402A Unspecified open wound of left hand, initial encounter: Secondary | ICD-10-CM | POA: Diagnosis not present

## 2015-02-16 DIAGNOSIS — Y998 Other external cause status: Secondary | ICD-10-CM | POA: Diagnosis not present

## 2015-02-16 DIAGNOSIS — W19XXXA Unspecified fall, initial encounter: Secondary | ICD-10-CM

## 2015-02-16 DIAGNOSIS — I10 Essential (primary) hypertension: Secondary | ICD-10-CM | POA: Insufficient documentation

## 2015-02-16 DIAGNOSIS — S6992XA Unspecified injury of left wrist, hand and finger(s), initial encounter: Secondary | ICD-10-CM | POA: Diagnosis present

## 2015-02-16 NOTE — ED Notes (Signed)
Bed: ZO10 Expected date:  Expected time:  Means of arrival:  Comments: EMS 95 fall

## 2015-02-16 NOTE — ED Notes (Signed)
DNR at bedside in room

## 2015-02-16 NOTE — ED Notes (Signed)
DNR YELLOW COPY GIVEN TO PTAR 

## 2015-02-16 NOTE — ED Notes (Signed)
DNR/YELLOW COPY---DENTURES GIVEN TO PTAR AT TIME OF DISCHARGE

## 2015-02-16 NOTE — ED Notes (Signed)
Warm blankets placed on pt. 

## 2015-02-16 NOTE — ED Notes (Signed)
Fall risk bracelet

## 2015-02-16 NOTE — ED Notes (Signed)
SPOKE WITH [place], [place] [phone]. MADE AWARE OF ELEVATED BP, PT WITHOUT SYMPTOMS, PENDING 0800 MEDICATIONS (EDP ZACKOWSKI RECOMMENDS 0800 MEDS BE GIVEN). PT IS OKAY FOR D/C PER EDP ZACKOWSKI.

## 2015-02-16 NOTE — ED Notes (Signed)
Pt arrived from [facility] via EMS, she had walked up to nurses desk and said she fell,  Denies head injury,  On hand injury, which is bandaged upon arrival.

## 2015-02-16 NOTE — ED Provider Notes (Signed)
CSN: [id number]     Arrival date & time 02/16/15  0508 History   First MD Initiated Contact with Patient 02/16/15 [phone]     Chief Complaint  Patient presents with  . Fall  . Hand Injury     (Consider location/radiation/quality/duration/timing/severity/associated sxs/prior Treatment) Patient is a 81 y.o. female presenting with fall and hand injury. The history is provided by the EMS personnel. The history is limited by the condition of the patient (level 5 caveat, dementia).  Fall This is a recurrent problem. The current episode started less than 1 hour ago. The problem occurs constantly. The problem has not changed since onset.Pertinent negatives include no abdominal pain. Nothing aggravates the symptoms. Nothing relieves the symptoms. She has tried nothing for the symptoms. The treatment provided no relief.  Hand Injury Location:  Hand Injury: yes   Mechanism of injury: fall   Hand location:  L hand Pain details:    Severity:  No pain   Timing:  Constant   Progression:  Unchanged Chronicity:  New Dislocation: no   Foreign body present:  No foreign bodies Tetanus status:  Up to date Relieved by:  Nothing Worsened by:  Nothing tried Ineffective treatments:  None tried Associated symptoms: no back pain   Risk factors: no concern for non-accidental trauma   skin tear to the dorsum of the left hand  Past Medical History  Diagnosis Date  . Hypertension   . Alzheimer disease   . Osteoporosis   . Heart disease   . Glaucoma    History reviewed. No pertinent past surgical history. History reviewed. No pertinent family history. History  Substance Use Topics  . Smoking status: Former Games developer  . Smokeless tobacco: Not on file  . Alcohol Use: No   OB History    No data available     Review of Systems  Unable to perform ROS Gastrointestinal: Negative for abdominal pain.  Musculoskeletal: Negative for back pain.      Allergies  Dairy aid and Eggs or egg-derived  products  Home Medications   Prior to Admission medications   Medication Sig Start Date End Date Taking? Authorizing Provider  ALPRAZolam (XANAX) 0.25 MG tablet Take 0.25 mg by mouth 2 (two) times daily.    Yes Historical Provider, MD  aspirin EC 81 MG tablet Take 81 mg by mouth daily with breakfast.   Yes Historical Provider, MD  benazepril (LOTENSIN) 20 MG tablet Take 20 mg by mouth daily with breakfast.    Yes Historical Provider, MD  Calcium Carb-Cholecalciferol (CALCIUM-VITAMIN D) 600-400 MG-UNIT TABS Take 1 tablet by mouth 2 (two) times daily.   Yes Historical Provider, MD  citalopram (CELEXA) 20 MG tablet Take 20 mg by mouth daily with breakfast.    Yes Historical Provider, MD  latanoprost (XALATAN) 0.005 % ophthalmic solution Place 1 drop into both eyes at bedtime.   Yes Historical Provider, MD  Memantine HCl ER (NAMENDA XR) 28 MG CP24 Take 1 capsule by mouth daily with breakfast.    Yes Historical Provider, MD  propranolol (INDERAL) 20 MG tablet Take 20 mg by mouth 2 (two) times daily.   Yes Historical Provider, MD  Vitamin D, Ergocalciferol, (DRISDOL) 50000 UNITS CAPS capsule Take 50,000 Units by mouth every Wednesday.    Yes Historical Provider, MD  white petrolatum (VASELINE) GEL Apply 1 application topically 2 (two) times daily as needed. Apply to area twice daily until healed   Yes Historical Provider, MD  acetaminophen (TYLENOL) 500 MG tablet Take  500 mg by mouth every 4 (four) hours as needed for mild pain, fever or headache.    Historical Provider, MD  alendronate (FOSAMAX) 70 MG tablet Take 70 mg by mouth every Wednesday.     Historical Provider, MD  ALPRAZolam Prudy Feeler) 0.25 MG tablet Take 0.25 mg by mouth every 8 (eight) hours as needed for anxiety.    Historical Provider, MD  alum & mag hydroxide-simeth (MAALOX/MYLANTA) 200-200-20 MG/5ML suspension Take 30 mLs by mouth as needed for indigestion or heartburn (do not exceed 4 doses in 24 hours).     Historical Provider, MD   guaifenesin (ROBITUSSIN) 100 MG/5ML syrup Take 200 mg by mouth every 6 (six) hours as needed for cough.    Historical Provider, MD  loperamide (IMODIUM) 2 MG capsule Take 2 mg by mouth as needed for diarrhea or loose stools (do not exceed 8 doses in 24 hours).    Historical Provider, MD  magnesium hydroxide (MILK OF MAGNESIA) 400 MG/5ML suspension Take 30 mLs by mouth daily as needed for mild constipation.    Historical Provider, MD  neomycin-bacitracin-polymyxin (NEOSPORIN) ointment Apply 1 application topically as needed for wound care.     Historical Provider, MD  Nutritional Supplements (NUTRITIONAL DRINK PO) Take 1 Container by mouth 3 (three) times daily with meals. CAN NOT BE MILK BASED    Historical Provider, MD  Witch Hazel (PREPARATION H EX) Apply 1 application topically 4 (four) times daily as needed (for itching or Hemorrhoids pain).    Historical Provider, MD   BP 157/77 mmHg  Pulse 60  Temp(Src) 98.3 F (36.8 C) (Oral)  Resp 18  SpO2 98% Physical Exam  Constitutional: She is oriented to person, place, and time. She appears well-developed and well-nourished.  HENT:  Head: Normocephalic and atraumatic. Head is without raccoon's eyes.  Right Ear: No mastoid tenderness. No hemotympanum.  Left Ear: No mastoid tenderness. No hemotympanum.  Mouth/Throat: Oropharynx is clear and moist.  Eyes: Conjunctivae are normal. Pupils are equal, round, and reactive to light.  Neck: Normal range of motion. Neck supple.  Cardiovascular: Normal rate, regular rhythm, normal heart sounds and intact distal pulses.   Pulmonary/Chest: Effort normal and breath sounds normal. No respiratory distress. She has no wheezes. She has no rales.  Abdominal: Soft. Bowel sounds are normal. There is no tenderness. There is no rebound and no guarding.  Pelvis is stable  Musculoskeletal: Normal range of motion.       Left wrist: She exhibits normal range of motion, no tenderness, no bony tenderness, no swelling, no  effusion and no crepitus.       Left hand: She exhibits normal range of motion. Normal sensation noted. Normal strength noted.  Neurological: She is alert and oriented to person, place, and time. She has normal reflexes.  Skin: Skin is warm and dry.  Psychiatric: She has a normal mood and affect.    ED Course  Procedures (including critical care time) Labs Review Labs Reviewed - No data to display  Imaging Review No results found.   EKG Interpretation None      MDM   Final diagnoses:  Fall    Wound care:  No tenderness of the hand.  Highly doubt fracture.  No indication for stitches    Rollande Thursby, MD 02/16/15 [phone]

## 2015-09-28 ENCOUNTER — Encounter (HOSPITAL_COMMUNITY): Payer: Self-pay | Admitting: *Deleted

## 2015-09-28 ENCOUNTER — Emergency Department (HOSPITAL_COMMUNITY): Payer: Medicare Other

## 2015-09-28 ENCOUNTER — Emergency Department (HOSPITAL_COMMUNITY)
Admission: EM | Admit: 2015-09-28 | Discharge: 2015-09-28 | Disposition: A | Discharge status: Home / Self Care | Payer: Medicare Other | Attending: Physician Assistant | Admitting: Physician Assistant

## 2015-09-28 DIAGNOSIS — Y998 Other external cause status: Secondary | ICD-10-CM | POA: Diagnosis not present

## 2015-09-28 DIAGNOSIS — Z7982 Long term (current) use of aspirin: Secondary | ICD-10-CM | POA: Insufficient documentation

## 2015-09-28 DIAGNOSIS — W1839XA Other fall on same level, initial encounter: Secondary | ICD-10-CM | POA: Insufficient documentation

## 2015-09-28 DIAGNOSIS — S60511A Abrasion of right hand, initial encounter: Secondary | ICD-10-CM | POA: Insufficient documentation

## 2015-09-28 DIAGNOSIS — Z87891 Personal history of nicotine dependence: Secondary | ICD-10-CM | POA: Insufficient documentation

## 2015-09-28 DIAGNOSIS — S6991XA Unspecified injury of right wrist, hand and finger(s), initial encounter: Secondary | ICD-10-CM | POA: Diagnosis present

## 2015-09-28 DIAGNOSIS — Z79899 Other long term (current) drug therapy: Secondary | ICD-10-CM | POA: Insufficient documentation

## 2015-09-28 DIAGNOSIS — G309 Alzheimer's disease, unspecified: Secondary | ICD-10-CM | POA: Diagnosis not present

## 2015-09-28 DIAGNOSIS — W19XXXA Unspecified fall, initial encounter: Secondary | ICD-10-CM

## 2015-09-28 DIAGNOSIS — F0280 Dementia in other diseases classified elsewhere without behavioral disturbance: Secondary | ICD-10-CM | POA: Diagnosis not present

## 2015-09-28 DIAGNOSIS — Y9389 Activity, other specified: Secondary | ICD-10-CM | POA: Diagnosis not present

## 2015-09-28 DIAGNOSIS — Y9289 Other specified places as the place of occurrence of the external cause: Secondary | ICD-10-CM | POA: Insufficient documentation

## 2015-09-28 DIAGNOSIS — I10 Essential (primary) hypertension: Secondary | ICD-10-CM | POA: Insufficient documentation

## 2015-09-28 LAB — URINE MICROSCOPIC-ADD ON: RBC / HPF: NONE SEEN RBC/hpf (ref 0–5)

## 2015-09-28 LAB — URINALYSIS, ROUTINE W REFLEX MICROSCOPIC
Bilirubin Urine: NEGATIVE
Glucose, UA: NEGATIVE mg/dL
Hgb urine dipstick: NEGATIVE
Ketones, ur: NEGATIVE mg/dL
NITRITE: POSITIVE — AB
PH: 7.5 (ref 5.0–8.0)
Protein, ur: NEGATIVE mg/dL
SPECIFIC GRAVITY, URINE: 1.013 (ref 1.005–1.030)

## 2015-09-28 MED ORDER — CEPHALEXIN 500 MG PO CAPS: 500.0000 mg | ORAL_CAPSULE | Freq: Two times a day (BID) | ORAL | Status: DC

## 2015-09-28 MED ORDER — CEPHALEXIN 250 MG PO CAPS
500.0000 mg | ORAL_CAPSULE | Freq: Once | ORAL | Status: AC
  Administered 2015-09-28: 500 mg via ORAL
  Filled 2015-09-28: qty 2

## 2015-09-28 NOTE — ED Notes (Signed)
[facility], per report pt was found on the floor in her room, pt placed in towel roll, pt moves all extremities, denies pain, pt ambulatory at baseline, unknown LOC, pt A&O x 3, pt has dementia per baseline, pt disoriented to place, no bruising & deformity noted, skin intact

## 2015-09-28 NOTE — ED Provider Notes (Signed)
CSN: [id number]     Arrival date & time 09/28/15  [id number] History   First MD Initiated Contact with Patient 09/28/15 1006     Chief Complaint  Patient presents with  . Fall     (Consider location/radiation/quality/duration/timing/severity/associated sxs/prior Treatment) HPI   Patient is a 82 year old female who is pleasantly demented. She is presenting with unwitnessed fall at home. According to nursing home have to sign out every patient falls. Patient has no external evidence of trauma. Patient's been here for falls multiple times the past. Patient is incredibly friendly, does not complain of any pain anywhere.  Level V caveat dementia.   Past Medical History  Diagnosis Date  . Hypertension   . Alzheimer disease   . Osteoporosis   . Heart disease   . Glaucoma    History reviewed. No pertinent past surgical history. No family history on file. Social History  Substance Use Topics  . Smoking status: Former Games developermoker  . Smokeless tobacco: None  . Alcohol Use: No   OB History    No data available     Review of Systems  Unable to perform ROS: Dementia      Allergies  Dairy aid and Eggs or egg-derived products  Home Medications   Prior to Admission medications   Medication Sig Start Date End Date Taking? Authorizing Provider  alendronate (FOSAMAX) 70 MG tablet Take 70 mg by mouth every Wednesday.    Yes Historical Provider, MD  aspirin EC 81 MG tablet Take 81 mg by mouth daily with breakfast.   Yes Historical Provider, MD  benazepril (LOTENSIN) 20 MG tablet Take 20 mg by mouth daily with breakfast.    Yes Historical Provider, MD  Calcium Carb-Cholecalciferol (CALCIUM-VITAMIN D) 600-400 MG-UNIT TABS Take 1 tablet by mouth 2 (two) times daily.   Yes Historical Provider, MD  Cholecalciferol (VITAMIN D3) 2000 units TABS Take 1 tablet by mouth daily.   Yes Historical Provider, MD  citalopram (CELEXA) 20 MG tablet Take 20 mg by mouth daily with breakfast.    Yes Historical  Provider, MD  latanoprost (XALATAN) 0.005 % ophthalmic solution Place 1 drop into both eyes at bedtime.   Yes Historical Provider, MD  Memantine HCl ER (NAMENDA XR) 28 MG CP24 Take 1 capsule by mouth daily with breakfast.    Yes Historical Provider, MD  propranolol (INDERAL) 20 MG tablet Take 20 mg by mouth 2 (two) times daily.   Yes Historical Provider, MD  acetaminophen (TYLENOL) 500 MG tablet Take 500 mg by mouth every 4 (four) hours as needed for mild pain, fever or headache.    Historical Provider, MD  ALPRAZolam Prudy Feeler(XANAX) 0.25 MG tablet Take 0.25 mg by mouth 2 (two) times daily.     Historical Provider, MD  ALPRAZolam Prudy Feeler(XANAX) 0.25 MG tablet Take 0.25 mg by mouth every 8 (eight) hours as needed for anxiety.    Historical Provider, MD  alum & mag hydroxide-simeth (MAALOX/MYLANTA) 200-200-20 MG/5ML suspension Take 30 mLs by mouth as needed for indigestion or heartburn (do not exceed 4 doses in 24 hours).     Historical Provider, MD  cephALEXin (KEFLEX) 500 MG capsule Take 1 capsule (500 mg total) by mouth 2 (two) times daily. 09/28/15   Voula Waln Lyn Kennedy Bohanon, MD  guaifenesin (ROBITUSSIN) 100 MG/5ML syrup Take 200 mg by mouth every 6 (six) hours as needed for cough.    Historical Provider, MD  loperamide (IMODIUM) 2 MG capsule Take 2 mg by mouth as needed for diarrhea or loose  stools (do not exceed 8 doses in 24 hours).    Historical Provider, MD  magnesium hydroxide (MILK OF MAGNESIA) 400 MG/5ML suspension Take 30 mLs by mouth daily as needed for mild constipation.    Historical Provider, MD  neomycin-bacitracin-polymyxin (NEOSPORIN) ointment Apply 1 application topically as needed for wound care.     Historical Provider, MD  Nutritional Supplements (NUTRITIONAL DRINK PO) Take 1 Container by mouth 3 (three) times daily with meals. CAN NOT BE MILK BASED    Historical Provider, MD  Vitamin D, Ergocalciferol, (DRISDOL) 50000 UNITS CAPS capsule Take 50,000 Units by mouth every Wednesday.     Historical  Provider, MD  white petrolatum (VASELINE) GEL Apply 1 application topically 2 (two) times daily as needed. Apply to area twice daily until healed    Historical Provider, MD  Witch Hazel (PREPARATION H EX) Apply 1 application topically 4 (four) times daily as needed (for itching or Hemorrhoids pain).    Historical Provider, MD   BP 186/76 mmHg  Pulse 53  Temp(Src) 97.9 F (36.6 C) (Oral)  Resp 14  SpO2 97% Physical Exam  Constitutional: She appears well-developed and well-nourished.  HENT:  Head: Normocephalic and atraumatic.  Eyes: Conjunctivae are normal. Right eye exhibits no discharge.  Neck: Neck supple.  Cardiovascular: Normal rate, regular rhythm and normal heart sounds.   No murmur heard. Pulmonary/Chest: Effort normal and breath sounds normal. She has no wheezes. She has no rales.  Abdominal: Soft. She exhibits no distension. There is no tenderness.  Musculoskeletal: Normal range of motion. She exhibits no edema.  Neurological: No cranial nerve deficit.  Patient oriented to self only.  Skin: Skin is warm and dry. No rash noted. She is not diaphoretic.  No signs of ecchymosis, less than 0.5 cm abrasion to her right hand.  Psychiatric: She has a normal mood and affect. Her behavior is normal.  Nursing note and vitals reviewed.   ED Course  Procedures (including critical care time) Labs Review Labs Reviewed  URINALYSIS, ROUTINE W REFLEX MICROSCOPIC (NOT AT [facility]) - Abnormal; Notable for the following:    APPearance CLOUDY (*)    Nitrite POSITIVE (*)    Leukocytes, UA TRACE (*)    All other components within normal limits  URINE MICROSCOPIC-ADD ON - Abnormal; Notable for the following:    Squamous Epithelial / LPF 0-5 (*)    Bacteria, UA MANY (*)    All other components within normal limits  URINE CULTURE    Imaging Review Ct Head Wo Contrast  09/28/2015  CLINICAL DATA:  Found on floor at nursing facility. EXAM: CT HEAD WITHOUT CONTRAST CT CERVICAL SPINE WITHOUT  CONTRAST TECHNIQUE: Multidetector CT imaging of the head and cervical spine was performed following the standard protocol without intravenous contrast. Multiplanar CT image reconstructions of the cervical spine were also generated. COMPARISON:  02/16/2015 FINDINGS: CT HEAD FINDINGS Mild cerebral atrophy is unchanged. Again noted is diffuse low density throughout the white matter which appears chronic. No evidence for acute hemorrhage, mass lesion, midline shift, hydrocephalus or large infarct. No calvarial fracture. CT CERVICAL SPINE FINDINGS Lung apices are clear. Negative for an acute fracture or dislocation. Aortic arch is heavily calcified. No significant soft tissue swelling in the neck. Severe disc space loss at C5-C6 and C6-C7. Bilateral facet arthropathy. Stable grade 1 anterolisthesis at C4-C5. IMPRESSION: No acute intracranial abnormality. Evidence for chronic small vessel ischemic changes. No acute bone abnormality in cervical spine. Degenerative disc disease at C5-C7. Electronically Signed  By: Richarda Overlie M.D.   On: 09/28/2015 11:47   Ct Cervical Spine Wo Contrast  09/28/2015  CLINICAL DATA:  Found on floor at nursing facility. EXAM: CT HEAD WITHOUT CONTRAST CT CERVICAL SPINE WITHOUT CONTRAST TECHNIQUE: Multidetector CT imaging of the head and cervical spine was performed following the standard protocol without intravenous contrast. Multiplanar CT image reconstructions of the cervical spine were also generated. COMPARISON:  02/16/2015 FINDINGS: CT HEAD FINDINGS Mild cerebral atrophy is unchanged. Again noted is diffuse low density throughout the white matter which appears chronic. No evidence for acute hemorrhage, mass lesion, midline shift, hydrocephalus or large infarct. No calvarial fracture. CT CERVICAL SPINE FINDINGS Lung apices are clear. Negative for an acute fracture or dislocation. Aortic arch is heavily calcified. No significant soft tissue swelling in the neck. Severe disc space loss at  C5-C6 and C6-C7. Bilateral facet arthropathy. Stable grade 1 anterolisthesis at C4-C5. IMPRESSION: No acute intracranial abnormality. Evidence for chronic small vessel ischemic changes. No acute bone abnormality in cervical spine. Degenerative disc disease at C5-C7. Electronically Signed   By: Richarda Overlie M.D.   On: 09/28/2015 11:47   Dg Hand Complete Right  09/28/2015  CLINICAL DATA:  Right hand pain after fall EXAM: RIGHT HAND - COMPLETE 3+ VIEW COMPARISON:  None. FINDINGS: Pulse oximeter obscures visualization of the distal right third finger. No fracture, dislocation or suspicious focal osseous lesion. Severe osteoarthritis at the first carpometacarpal joint. Severe osteoarthritis throughout the distal interphalangeal joints of the second through fifth fingers with central erosions. Chondrocalcinosis in the triangular fibrocartilage. Severe osteoarthritis at the distal scaphoid articulations. IMPRESSION: 1. No fracture or dislocation. 2. Severe polyarticular erosive osteoarthritis. Electronically Signed   By: Delbert Phenix M.D.   On: 09/28/2015 11:12   I have personally reviewed and evaluated these images and lab results as part of my medical decision-making.   EKG Interpretation   Date/Time:  Sunday September 28 2015 10:24:20 EDT Ventricular Rate:  50 PR Interval:  196 QRS Duration: 125 QT Interval:  451 QTC Calculation: 411 R Axis:   58 Text Interpretation:  Sinus rhythm Nonspecific intraventricular conduction  delay Borderline T abnormalities, diffuse leads no acute ischemia  bradycardia  ; Confirmed by Kandis Mannan ([id number]) on 09/28/2015  10:53:04 AM      MDM   Final diagnoses:  Fall, initial encounter  Very pleasant demented 51 her old female presenting after unwitnessed fall. Patient is unsteady at baseline. She's been here with falls multiple times the past. Patient has no complaints at this time. We'll do CT head and neck given her age. We'll image her right wrist given the  abrasion. I have no reason to think this is syncope or that a workup would benefit patient in hospital stay given her level of dementia. Will update Tdap as well. Otherwise patient's physical exam appears likely baseline. We will send urine.  Urine shows infection.  Will treat. NOrnmal vital signs,  patietn feels basleine. Plan to discharge with daughter.   Yetunde Leis Randall An, MD 09/28/15 1640

## 2015-09-30 LAB — URINE CULTURE

## 2015-10-01 ENCOUNTER — Telehealth (HOSPITAL_BASED_OUTPATIENT_CLINIC_OR_DEPARTMENT_OTHER): Payer: Self-pay | Admitting: Emergency Medicine

## 2015-10-01 NOTE — Progress Notes (Signed)
ED Antimicrobial Stewardship Positive Culture Follow Up   Colleen Wise is an 82 y.o. female who [facility] with a chief complaint of  Chief Complaint  Patient presents with  . Fall    Recent Results (from the past 720 hour(s))  Urine culture     Status: None   Collection Time: 09/28/15 10:22 AM  Result Value Ref Range Status   Specimen Description URINE, CLEAN CATCH  Final   Special Requests NONE  Final   Culture >=100,000 COLONIES/mL CITROBACTER YOUNGAE  Final   Report Status 09/30/2015 FINAL  Final   Organism ID, Bacteria CITROBACTER YOUNGAE  Final      Susceptibility   Citrobacter youngae - MIC*    CEFAZOLIN >=64 RESISTANT Resistant     CEFTRIAXONE <=1 SENSITIVE Sensitive     CIPROFLOXACIN <=0.25 SENSITIVE Sensitive     GENTAMICIN <=1 SENSITIVE Sensitive     IMIPENEM <=0.25 SENSITIVE Sensitive     NITROFURANTOIN <=16 SENSITIVE Sensitive     TRIMETH/SULFA <=20 SENSITIVE Sensitive     * >=100,000 COLONIES/mL CITROBACTER YOUNGAE   Presented with fall from nursing home. No urinary complaints, pleasantly demented.   [x]  Treated with cephalexin, organism resistant to prescribed antimicrobial []  Patient discharged originally without antimicrobial agent and treatment is now indicated  New antibiotic prescription: Stop Keflex. Start Bactrim SS 1 tab BID x 3 days  ED Provider: Teressa LowerVrinda Pickering, FNP   [name] 10/01/2015, 9:13 AM Infectious Diseases Pharmacist Phone# [phone]

## 2015-10-01 NOTE — Telephone Encounter (Signed)
Post ED Visit - Positive Culture Follow-up: Successful Patient Follow-Up  Culture assessed and recommendations reviewed by: []  Enzo BiNathan Batchelder, Pharm.D. []  Celedonio MiyamotoJeremy Frens, Pharm.D., BCPS []  Garvin FilaMike Maccia, Pharm.D. []  Georgina PillionElizabeth Martin, Pharm.D., BCPS []  [place] Pham, [address]Pharm.D., BCPS, AAHIVP []  Estella HuskMichelle Turner, Pharm.D., BCPS, AAHIVP [x]  Tennis Mustassie Stewart, Pharm.D. []  Sherle Poeob Vincent, [address]Pharm[address]  Positive urine culture Citrobacter  []  Patient discharged without antimicrobial prescription and treatment is now indicated [x]  Organism is resistant to prescribed ED discharge antimicrobial []  Patient with positive blood cultures  Changes discussed with ED provider: Teressa LowerVrinda Pickering FNP New antibiotic prescription stop keflex, start bactrim single strength 1 tab bid x 3 days  [facility]  Faxed to [phone]   Berle MullMiller, Shaydon Lease 10/01/2015, 12:45 PM

## 2015-10-11 ENCOUNTER — Encounter (HOSPITAL_COMMUNITY): Payer: Self-pay | Admitting: Emergency Medicine

## 2015-10-11 ENCOUNTER — Emergency Department (HOSPITAL_COMMUNITY)
Admission: EM | Admit: 2015-10-11 | Discharge: 2015-10-11 | Disposition: A | Discharge status: Home / Self Care | Payer: Medicare Other | Attending: Emergency Medicine | Admitting: Emergency Medicine

## 2015-10-11 ENCOUNTER — Emergency Department (HOSPITAL_COMMUNITY): Payer: Medicare Other

## 2015-10-11 DIAGNOSIS — I10 Essential (primary) hypertension: Secondary | ICD-10-CM | POA: Diagnosis not present

## 2015-10-11 DIAGNOSIS — M810 Age-related osteoporosis without current pathological fracture: Secondary | ICD-10-CM | POA: Diagnosis not present

## 2015-10-11 DIAGNOSIS — G309 Alzheimer's disease, unspecified: Secondary | ICD-10-CM | POA: Insufficient documentation

## 2015-10-11 DIAGNOSIS — H409 Unspecified glaucoma: Secondary | ICD-10-CM | POA: Insufficient documentation

## 2015-10-11 DIAGNOSIS — S0990XA Unspecified injury of head, initial encounter: Secondary | ICD-10-CM | POA: Diagnosis present

## 2015-10-11 DIAGNOSIS — Y9389 Activity, other specified: Secondary | ICD-10-CM | POA: Insufficient documentation

## 2015-10-11 DIAGNOSIS — Y998 Other external cause status: Secondary | ICD-10-CM | POA: Insufficient documentation

## 2015-10-11 DIAGNOSIS — S0101XA Laceration without foreign body of scalp, initial encounter: Secondary | ICD-10-CM | POA: Insufficient documentation

## 2015-10-11 DIAGNOSIS — S0191XA Laceration without foreign body of unspecified part of head, initial encounter: Secondary | ICD-10-CM

## 2015-10-11 DIAGNOSIS — W1839XA Other fall on same level, initial encounter: Secondary | ICD-10-CM | POA: Diagnosis not present

## 2015-10-11 DIAGNOSIS — Y9289 Other specified places as the place of occurrence of the external cause: Secondary | ICD-10-CM | POA: Insufficient documentation

## 2015-10-11 DIAGNOSIS — Z87891 Personal history of nicotine dependence: Secondary | ICD-10-CM | POA: Diagnosis not present

## 2015-10-11 DIAGNOSIS — Z7982 Long term (current) use of aspirin: Secondary | ICD-10-CM | POA: Diagnosis not present

## 2015-10-11 DIAGNOSIS — F0280 Dementia in other diseases classified elsewhere without behavioral disturbance: Secondary | ICD-10-CM | POA: Insufficient documentation

## 2015-10-11 DIAGNOSIS — W19XXXA Unspecified fall, initial encounter: Secondary | ICD-10-CM

## 2015-10-11 DIAGNOSIS — Z79899 Other long term (current) drug therapy: Secondary | ICD-10-CM | POA: Insufficient documentation

## 2015-10-11 LAB — BASIC METABOLIC PANEL WITH GFR
Anion gap: 8 (ref 5–15)
BUN: 27 mg/dL — ABNORMAL HIGH (ref 6–20)
CO2: 27 mmol/L (ref 22–32)
Calcium: 9.3 mg/dL (ref 8.9–10.3)
Chloride: 105 mmol/L (ref 101–111)
Creatinine, Ser: 1.13 mg/dL — ABNORMAL HIGH (ref 0.44–1.00)
GFR calc Af Amer: 46 mL/min — ABNORMAL LOW
GFR calc non Af Amer: 40 mL/min — ABNORMAL LOW
Glucose, Bld: 132 mg/dL — ABNORMAL HIGH (ref 65–99)
Potassium: 4 mmol/L (ref 3.5–5.1)
Sodium: 140 mmol/L (ref 135–145)

## 2015-10-11 LAB — URINALYSIS, ROUTINE W REFLEX MICROSCOPIC
BILIRUBIN URINE: NEGATIVE
GLUCOSE, UA: NEGATIVE mg/dL
HGB URINE DIPSTICK: NEGATIVE
KETONES UR: NEGATIVE mg/dL
Leukocytes, UA: NEGATIVE
Nitrite: NEGATIVE
PH: 6 (ref 5.0–8.0)
PROTEIN: NEGATIVE mg/dL
Specific Gravity, Urine: 1.019 (ref 1.005–1.030)

## 2015-10-11 LAB — CBC
HEMATOCRIT: 39.4 % (ref 36.0–46.0)
Hemoglobin: 13 g/dL (ref 12.0–15.0)
MCH: 31.8 pg (ref 26.0–34.0)
MCHC: 33 g/dL (ref 30.0–36.0)
MCV: 96.3 fL (ref 78.0–100.0)
PLATELETS: 118 10*3/uL — AB (ref 150–400)
RBC: 4.09 MIL/uL (ref 3.87–5.11)
RDW: 13.2 % (ref 11.5–15.5)
WBC: 4.4 10*3/uL (ref 4.0–10.5)

## 2015-10-11 MED ORDER — LIDOCAINE-EPINEPHRINE 1 %-1:100000 IJ SOLN
20.0000 mL | Freq: Once | INTRAMUSCULAR | Status: AC
Start: 2015-10-11 — End: 2015-10-11
  Administered 2015-10-11: 20 mL via INTRADERMAL
  Filled 2015-10-11: qty 1

## 2015-10-11 NOTE — ED Notes (Signed)
Patient returned from CT

## 2015-10-11 NOTE — ED Notes (Signed)
Patient transported to X-ray 

## 2015-10-11 NOTE — ED Provider Notes (Signed)
CSN: [id number]     Arrival date & time 10/11/15  1504 History   First MD Initiated Contact with Patient 10/11/15 1600     Chief Complaint  Patient presents with  . Fall  . Head Laceration     (Consider location/radiation/quality/duration/timing/severity/associated sxs/prior Treatment) Patient is a 82 y.o. female presenting with fall and scalp laceration. The history is provided by the patient.  Fall This is a new problem. Pertinent negatives include no chest pain, no headaches and no shortness of breath.  Head Laceration Pertinent negatives include no chest pain, no headaches and no shortness of breath.   82 yo F The chief complaint of a fall. Patient was noticed at the window on the ground. Patient has significant dementia is unable to further discuss what happened. Level 5 caveat dementia.  Past Medical History  Diagnosis Date  . Hypertension   . Alzheimer disease   . Osteoporosis   . Heart disease   . Glaucoma    History reviewed. No pertinent past surgical history. History reviewed. No pertinent family history. Social History  Substance Use Topics  . Smoking status: Former Games developer  . Smokeless tobacco: None  . Alcohol Use: No   OB History    No data available     Review of Systems  Unable to perform ROS: Dementia  Constitutional: Negative for fever and chills.  HENT: Negative for congestion and rhinorrhea.   Eyes: Negative for redness and visual disturbance.  Respiratory: Negative for shortness of breath and wheezing.   Cardiovascular: Negative for chest pain and palpitations.  Gastrointestinal: Negative for nausea and vomiting.  Genitourinary: Negative for dysuria and urgency.  Musculoskeletal: Negative for myalgias and arthralgias.  Skin: Negative for pallor and wound.  Neurological: Negative for dizziness and headaches.      Allergies  Dairy aid and Eggs or egg-derived products  Home Medications   Prior to Admission medications   Medication Sig Start  Date End Date Taking? Authorizing Provider  acetaminophen (TYLENOL) 500 MG tablet Take 500 mg by mouth every 4 (four) hours as needed for mild pain, fever or headache.   Yes Historical Provider, MD  alendronate (FOSAMAX) 70 MG tablet Take 70 mg by mouth every Wednesday.    Yes Historical Provider, MD  ALPRAZolam (XANAX) 0.25 MG tablet Take 0.25 mg by mouth every 8 (eight) hours as needed for anxiety.    Yes Historical Provider, MD  aspirin EC 81 MG tablet Take 81 mg by mouth daily with breakfast.   Yes Historical Provider, MD  benazepril (LOTENSIN) 20 MG tablet Take 20 mg by mouth daily with breakfast.    Yes Historical Provider, MD  Calcium Carb-Cholecalciferol (CALCIUM-VITAMIN D) 600-400 MG-UNIT TABS Take 1 tablet by mouth 2 (two) times daily.   Yes Historical Provider, MD  Cholecalciferol (VITAMIN D3) 2000 units TABS Take 2,000 Units by mouth daily.    Yes Historical Provider, MD  citalopram (CELEXA) 20 MG tablet Take 20 mg by mouth daily with breakfast.    Yes Historical Provider, MD  guaifenesin (ROBITUSSIN) 100 MG/5ML syrup Take 200 mg by mouth every 6 (six) hours as needed for cough.   Yes Historical Provider, MD  latanoprost (XALATAN) 0.005 % ophthalmic solution Place 1 drop into both eyes at bedtime.   Yes Historical Provider, MD  loperamide (IMODIUM) 2 MG capsule Take 2 mg by mouth as needed for diarrhea or loose stools (do not exceed 8 doses in 24 hours).   Yes Historical Provider, MD  magnesium hydroxide (  MILK OF MAGNESIA) 400 MG/5ML suspension Take 30 mLs by mouth daily as needed for mild constipation.   Yes Historical Provider, MD  Memantine HCl ER (NAMENDA XR) 28 MG CP24 Take 1 capsule by mouth daily with breakfast.    Yes Historical Provider, MD  neomycin-bacitracin-polymyxin (NEOSPORIN) ointment Apply 1 application topically as needed for wound care.    Yes Historical Provider, MD  propranolol (INDERAL) 20 MG tablet Take 20 mg by mouth 2 (two) times daily.   Yes Historical Provider,  MD  white petrolatum (VASELINE) GEL Apply 1 application topically 2 (two) times daily as needed. Apply to area twice daily until healed   Yes Historical Provider, MD  Witch Hazel (PREPARATION H EX) Apply 1 application topically 4 (four) times daily as needed (for itching or Hemorrhoids pain). Reported on 10/11/2015   Yes Historical Provider, MD  alum & mag hydroxide-simeth (MAALOX/MYLANTA) 200-200-20 MG/5ML suspension Take 30 mLs by mouth as needed for indigestion or heartburn (do not exceed 4 doses in 24 hours).     Historical Provider, MD  Nutritional Supplements (NUTRITIONAL DRINK PO) Take 1 Container by mouth 3 (three) times daily with meals. CAN NOT BE MILK BASED    Historical Provider, MD  Vitamin D, Ergocalciferol, (DRISDOL) 50000 UNITS CAPS capsule Take 50,000 Units by mouth every Wednesday.     Historical Provider, MD   BP 154/94 mmHg  Pulse 56  Temp(Src) 97.3 F (36.3 C) (Oral)  Resp 16  Ht 5' (1.524 m)  Wt 100 lb (45.36 kg)  BMI 19.53 kg/m2  SpO2 96% Physical Exam  Constitutional: She is oriented to person, place, and time. She appears well-developed and well-nourished. No distress.  HENT:  Head: Normocephalic and atraumatic.  8.5 cm crescent shaped  laceration to the forehead on the right side up in the hairline.  Eyes: EOM are normal. Pupils are equal, round, and reactive to light.  Neck: Normal range of motion. Neck supple.  Cardiovascular: Normal rate and regular rhythm.  Exam reveals no gallop and no friction rub.   No murmur heard. Pulmonary/Chest: Effort normal. She has no wheezes. She has no rales.  Abdominal: Soft. She exhibits no distension. There is no tenderness.  Musculoskeletal: She exhibits no edema or tenderness.  Neurological: She is alert and oriented to person, place, and time.  Skin: Skin is warm and dry. She is not diaphoretic.  Psychiatric: She has a normal mood and affect. Her behavior is normal.  Nursing note and vitals reviewed.   ED Course   .Marland KitchenLaceration Repair Date/Time: 10/11/2015 6:26 PM Performed by: Melene Plan Authorized by: Melene Plan Consent: Verbal consent obtained. Risks and benefits: risks, benefits and alternatives were discussed Consent given by: patient Patient identity confirmed: verbally with patient Time out: Immediately prior to procedure a "time out" was called to verify the correct patient, procedure, equipment, support staff and site/side marked as required. Body area: head/neck Location details: scalp Laceration length: 8.5 cm Foreign bodies: no foreign bodies Tendon involvement: none Nerve involvement: none Vascular damage: no Anesthesia: local infiltration Local anesthetic: lidocaine 1% with epinephrine Anesthetic total: 7 ml Patient sedated: no Preparation: Patient was prepped and draped in the usual sterile fashion. Irrigation method: jet lavage Amount of cleaning: extensive Debridement: none Degree of undermining: none Skin closure: staples Number of sutures: 6 Technique: simple Approximation: close Approximation difficulty: complex Patient tolerance: Patient tolerated the procedure well with no immediate complications Comments: Crescent shaped laceration down to the skull.  No noted damage to the galea.  Difficult with approximation due to depth differences from one side to other, complex repair.    (including critical care time) Labs Review Labs Reviewed  BASIC METABOLIC PANEL  CBC  URINALYSIS, ROUTINE W REFLEX MICROSCOPIC (NOT AT [facility])    Imaging Review No results found. I have personally reviewed and evaluated these images and lab results as part of my medical decision-making.   EKG Interpretation   Date/Time:  Saturday October 11 2015 15:24:03 EDT Ventricular Rate:  54 PR Interval:  200 QRS Duration: 97 QT Interval:  625 QTC Calculation: 592 R Axis:   32 Text Interpretation:  Sinus rhythm Low voltage, precordial leads Probable  anteroseptal infarct, old No significant  change since last tracing  Confirmed by Lashann Hagg MD, DANIEL [phone]([id number]) on 10/11/2015 5:15:18 PM      MDM   Final diagnoses:  None    82 yo F With a chief complaint of a fall. This happened at an unknown time. Patient was found on the ground. Has a laceration to the right side of her forehead. Patient denies any symptoms but does have some pain to her right wrist on palpation. Has a small skin tear there. Denies any back pain. Will obtain a CT of the head and C-spine. Basic lab evaluation to evaluate for possible syncope.  CT head and c spine negative.  Patient wound repaired at bedside after cleaning out of the wound. Awaiting UA.   UA negative, d/c home.  10:08 PM:  I have discussed the diagnosis/risks/treatment options with the patient and family and believe the pt to be eligible for discharge home to follow-up with PCP. We also discussed returning to the ED immediately if new or worsening sx occur. We discussed the sx which are most concerning (e.g., sudden worsening pain, fever, inability to tolerate by mouth) that necessitate immediate return. Medications administered to the patient during their visit and any new prescriptions provided to the patient are listed below.  Medications given during this visit Medications  lidocaine-EPINEPHrine (XYLOCAINE W/EPI) 1 %-1:100000 (with pres) injection 20 mL (20 mLs Intradermal Given 10/11/15 1700)    Discharge Medication List as of 10/11/2015  7:41 PM      The patient appears reasonably screen and/or stabilized for discharge and I doubt any other medical condition or other [facility] requiring further screening, evaluation, or treatment in the ED at this time prior to discharge.    Melene Planan Lexine Jaspers, DO 10/11/15 2208

## 2015-10-11 NOTE — Discharge Instructions (Signed)
Head Injury, Adult °You have a head injury. Headaches and throwing up (vomiting) are common after a head injury. It should be easy to wake up from sleeping. Sometimes you must stay in the hospital. Most problems happen within the first 24 hours. Side effects may occur up to 7-10 days after the injury.  °WHAT ARE THE TYPES OF HEAD INJURIES? °Head injuries can be as minor as a bump. Some head injuries can be more severe. More severe head injuries include: °· A jarring injury to the brain (concussion). °· A bruise of the brain (contusion). This mean there is bleeding in the brain that can cause swelling. °· A cracked skull (skull fracture). °· Bleeding in the brain that collects, clots, and forms a bump (hematoma). °WHEN SHOULD I GET HELP RIGHT AWAY?  °· You are confused or sleepy. °· You cannot be woken up. °· You feel sick to your stomach (nauseous) or keep throwing up (vomiting). °· Your dizziness or unsteadiness is getting worse. °· You have very bad, lasting headaches that are not helped by medicine. Take medicines only as told by your doctor. °· You cannot use your arms or legs like normal. °· You cannot walk. °· You notice changes in the black spots in the center of the colored part of your eye (pupil). °· You have clear or bloody fluid coming from your nose or ears. °· You have trouble seeing. °During the next 24 hours after the injury, you must stay with someone who can watch you. This person should get help right away (call 911 in the [place] if you start to shake and are not able to control it (have seizures), you pass out, or you are unable to wake up. °HOW CAN I PREVENT A HEAD INJURY IN THE FUTURE? °· Wear seat belts. °· Wear a helmet while bike riding and playing sports like football. °· Stay away from dangerous activities around the house. °WHEN CAN I RETURN TO NORMAL ACTIVITIES AND ATHLETICS? °See your doctor before doing these activities. You should not do normal activities or play contact sports until 1  week after the following symptoms have stopped: °· Headache that does not go away. °· Dizziness. °· Poor attention. °· Confusion. °· Memory problems. °· Sickness to your stomach or throwing up. °· Tiredness. °· Fussiness. °· Bothered by bright lights or loud noises. °· Anxiousness or depression. °· Restless sleep. °MAKE SURE YOU:  °· Understand these instructions. °· Will watch your condition. °· Will get help right away if you are not doing well or get worse. °  °This information is not intended to replace advice given to you by your health care provider. Make sure you discuss any questions you have with your health care provider. °  °Document Released: 06/10/2008 Document Revised: 07/19/2014 Document Reviewed: 03/05/2013 °Elsevier Interactive Patient Education ©2016 Elsevier Inc. ° °

## 2015-10-11 NOTE — ED Notes (Signed)
Called [facility] report given to WrensAshley.  Called secretary @ [phone] to call [facility] for transport.

## 2015-10-11 NOTE — ED Notes (Signed)
From [facility]. Unwitnessed fall on patio, staff saw her on ground through window. Staff reported to EMS that she is at her baseline. She does not recall falling and does not know why she fell. Arrives in [place], alert, oriented to self, place, and month. Denies pain. Laceration to right head and left hand. Bleeding controlled.

## 2015-10-11 NOTE — ED Notes (Signed)
[facility] arrived , report given.   

## 2015-10-20 ENCOUNTER — Emergency Department (HOSPITAL_COMMUNITY): Payer: Medicare Other

## 2015-10-20 ENCOUNTER — Encounter (HOSPITAL_COMMUNITY): Payer: Self-pay | Admitting: Emergency Medicine

## 2015-10-20 ENCOUNTER — Emergency Department (HOSPITAL_COMMUNITY)
Admission: EM | Admit: 2015-10-20 | Discharge: 2015-10-20 | Disposition: A | Discharge status: Home / Self Care | Payer: Medicare Other | Attending: Emergency Medicine | Admitting: Emergency Medicine

## 2015-10-20 DIAGNOSIS — G309 Alzheimer's disease, unspecified: Secondary | ICD-10-CM | POA: Diagnosis not present

## 2015-10-20 DIAGNOSIS — M810 Age-related osteoporosis without current pathological fracture: Secondary | ICD-10-CM | POA: Insufficient documentation

## 2015-10-20 DIAGNOSIS — Z043 Encounter for examination and observation following other accident: Secondary | ICD-10-CM | POA: Diagnosis present

## 2015-10-20 DIAGNOSIS — Y998 Other external cause status: Secondary | ICD-10-CM | POA: Diagnosis not present

## 2015-10-20 DIAGNOSIS — H409 Unspecified glaucoma: Secondary | ICD-10-CM | POA: Diagnosis not present

## 2015-10-20 DIAGNOSIS — Z7982 Long term (current) use of aspirin: Secondary | ICD-10-CM | POA: Diagnosis not present

## 2015-10-20 DIAGNOSIS — Y92129 Unspecified place in nursing home as the place of occurrence of the external cause: Secondary | ICD-10-CM | POA: Insufficient documentation

## 2015-10-20 DIAGNOSIS — W19XXXA Unspecified fall, initial encounter: Secondary | ICD-10-CM

## 2015-10-20 DIAGNOSIS — Z79899 Other long term (current) drug therapy: Secondary | ICD-10-CM | POA: Insufficient documentation

## 2015-10-20 DIAGNOSIS — Y9389 Activity, other specified: Secondary | ICD-10-CM | POA: Diagnosis not present

## 2015-10-20 DIAGNOSIS — Z87891 Personal history of nicotine dependence: Secondary | ICD-10-CM | POA: Insufficient documentation

## 2015-10-20 DIAGNOSIS — F0280 Dementia in other diseases classified elsewhere without behavioral disturbance: Secondary | ICD-10-CM | POA: Insufficient documentation

## 2015-10-20 DIAGNOSIS — I10 Essential (primary) hypertension: Secondary | ICD-10-CM | POA: Insufficient documentation

## 2015-10-20 DIAGNOSIS — W1839XA Other fall on same level, initial encounter: Secondary | ICD-10-CM | POA: Diagnosis not present

## 2015-10-20 NOTE — ED Notes (Signed)
MD at bedside. 

## 2015-10-20 NOTE — ED Notes (Signed)
Patient transported to X-ray 

## 2015-10-20 NOTE — Discharge Instructions (Signed)
Take your usual prescriptions as previously directed.  Call your regular medical doctor today to schedule a follow up appointment within the next 2 days.  Return to the Emergency Department immediately sooner if worsening.  ° °

## 2015-10-20 NOTE — ED Notes (Signed)
Patient transported to CT 

## 2015-10-20 NOTE — ED Provider Notes (Signed)
CSN: [id number]     Arrival date & time 10/20/15  1006 History   First MD Initiated Contact with Patient 10/20/15 1019     Chief Complaint  Patient presents with  . Fall      Patient is a 82 y.o. female presenting with fall. The history is provided by the patient, the nursing home and the EMS personnel. The history is limited by the condition of the patient (Hx dementia).  Fall  Pt was seen at 1020.  Per EMS, NH report and pt: Pt s/p fall PTA. NH staff found pt on floor between her bed and wheelchair. Pt currently denies complaints. Pt has significant hx of dementia.   Past Medical History  Diagnosis Date  . Hypertension   . Alzheimer disease   . Osteoporosis   . Heart disease   . Glaucoma    History reviewed. No pertinent past surgical history.  Social History  Substance Use Topics  . Smoking status: Former Games developermoker  . Smokeless tobacco: None  . Alcohol Use: No    Review of Systems  Unable to perform ROS: Dementia     Allergies  Dairy aid and Eggs or egg-derived products  Home Medications   Prior to Admission medications   Medication Sig Start Date End Date Taking? Authorizing Provider  acetaminophen (TYLENOL) 500 MG tablet Take 500 mg by mouth every 4 (four) hours as needed for mild pain, fever or headache.    Historical Provider, MD  alendronate (FOSAMAX) 70 MG tablet Take 70 mg by mouth every Wednesday.     Historical Provider, MD  ALPRAZolam Prudy Feeler(XANAX) 0.25 MG tablet Take 0.25 mg by mouth every 8 (eight) hours as needed for anxiety.     Historical Provider, MD  alum & mag hydroxide-simeth (MAALOX/MYLANTA) 200-200-20 MG/5ML suspension Take 30 mLs by mouth as needed for indigestion or heartburn (do not exceed 4 doses in 24 hours).     Historical Provider, MD  aspirin EC 81 MG tablet Take 81 mg by mouth daily with breakfast.    Historical Provider, MD  benazepril (LOTENSIN) 20 MG tablet Take 20 mg by mouth daily with breakfast.     Historical Provider, MD  Calcium  Carb-Cholecalciferol (CALCIUM-VITAMIN D) 600-400 MG-UNIT TABS Take 1 tablet by mouth 2 (two) times daily.    Historical Provider, MD  Cholecalciferol (VITAMIN D3) 2000 units TABS Take 2,000 Units by mouth daily.     Historical Provider, MD  citalopram (CELEXA) 20 MG tablet Take 20 mg by mouth daily with breakfast.     Historical Provider, MD  guaifenesin (ROBITUSSIN) 100 MG/5ML syrup Take 200 mg by mouth every 6 (six) hours as needed for cough.    Historical Provider, MD  latanoprost (XALATAN) 0.005 % ophthalmic solution Place 1 drop into both eyes at bedtime.    Historical Provider, MD  loperamide (IMODIUM) 2 MG capsule Take 2 mg by mouth as needed for diarrhea or loose stools (do not exceed 8 doses in 24 hours).    Historical Provider, MD  magnesium hydroxide (MILK OF MAGNESIA) 400 MG/5ML suspension Take 30 mLs by mouth daily as needed for mild constipation.    Historical Provider, MD  Memantine HCl ER (NAMENDA XR) 28 MG CP24 Take 1 capsule by mouth daily with breakfast.     Historical Provider, MD  neomycin-bacitracin-polymyxin (NEOSPORIN) ointment Apply 1 application topically as needed for wound care.     Historical Provider, MD  Nutritional Supplements (NUTRITIONAL DRINK PO) Take 1 Container by mouth 3 (  three) times daily with meals. CAN NOT BE MILK BASED    Historical Provider, MD  propranolol (INDERAL) 20 MG tablet Take 20 mg by mouth 2 (two) times daily.    Historical Provider, MD  Vitamin D, Ergocalciferol, (DRISDOL) 50000 UNITS CAPS capsule Take 50,000 Units by mouth every Wednesday.     Historical Provider, MD  white petrolatum (VASELINE) GEL Apply 1 application topically 2 (two) times daily as needed. Apply to area twice daily until healed    Historical Provider, MD  Witch Hazel (PREPARATION H EX) Apply 1 application topically 4 (four) times daily as needed (for itching or Hemorrhoids pain). Reported on 10/11/2015    Historical Provider, MD   BP 183/82 mmHg  Pulse 54  Temp(Src) 98.1 F  (36.7 C) (Oral)  Resp 18  SpO2 98% Physical Exam  1025: Physical examination: Vital signs and O2 SAT: Reviewed; Constitutional: Well developed, Well nourished, Well hydrated, In no acute distress; Head and Face: Normocephalic, +staples intact right forehead. No new lacerations, ecchymosis.; Eyes: EOMI, PERRL, No scleral icterus; ENMT: Mouth and pharynx normal, Left TM normal, Right TM normal, Mucous membranes moist; Neck: Supple, Trachea midline; Spine: No midline CS, TS, LS tenderness.; Cardiovascular: Regular rate and rhythm, No gallop; Respiratory: Breath sounds clear & equal bilaterally, No wheezes, Normal respiratory effort/excursion; Chest: Nontender, No deformity, Movement normal, No crepitus, No abrasions or ecchymosis.; Abdomen: Soft, Nontender, Nondistended, Normal bowel sounds, No abrasions or ecchymosis.; Genitourinary: No CVA tenderness;; Extremities: No deformity, Full range of motion major/large joints of bilat UE's and LE's without pain or tenderness to palp, Neurovascularly intact, Pulses normal, No tenderness, No edema, Pelvis stable; Neuro: Awake, alert, confused per hx dementia.  No facial droop. Speech clear. Grips equal. Strength 5/5 equal bilat UE's and LE's. No apparent gross focal motor deficits in extremities.; Skin: Color normal, Warm, Dry    ED Course  Procedures (including critical care time) Labs Review  Imaging Review  I have personally reviewed and evaluated these images and lab results as part of my medical decision-making.   EKG Interpretation None      MDM  MDM Reviewed: previous chart, nursing note and vitals Interpretation: x-ray and CT scan      Dg Chest 2 View 10/20/2015  CLINICAL DATA:  Fall.  Found on floor EXAM: CHEST  2 VIEW COMPARISON:  08/22/2014 FINDINGS: Heart size mildly enlarged. Negative for heart failure. Calcified granuloma left lower lobe unchanged. Aortic calcification. Negative for infiltrate or effusion. Soft tissue density to the  right of the trachea is unchanged from prior studies and compatible with vascular ectasia. IMPRESSION: No active cardiopulmonary disease. Electronically Signed   By: Marlan Palau M.D.   On: 10/20/2015 11:08   Dg Lumbar Spine Complete 10/20/2015  CLINICAL DATA:  Fall. EXAM: LUMBAR SPINE - COMPLETE 4+ VIEW COMPARISON:  No recent. FINDINGS: Thoracolumbar scoliosis. Multilevel degenerative change. Degenerative changes are severe. 4 mm anterolisthesis L4 on L5. No acute bony abnormality. Aortoiliac atherosclerotic vascular calcification. IMPRESSION: 1. Thoracolumbar spine scoliosis. 2. Severe multilevel diffuse degenerative change. 4 mm anterolisthesis L4 on L5. 3.  Aortoiliac atherosclerotic vascular disease. Electronically Signed   By: Maisie Fus  Register   On: 10/20/2015 11:11   Ct Head Wo Contrast 10/20/2015  CLINICAL DATA:  Fall EXAM: CT HEAD WITHOUT CONTRAST CT CERVICAL SPINE WITHOUT CONTRAST TECHNIQUE: Multidetector CT imaging of the head and cervical spine was performed following the standard protocol without intravenous contrast. Multiplanar CT image reconstructions of the cervical spine were also generated. COMPARISON:  10/11/2015 FINDINGS: CT HEAD FINDINGS There is atrophy and chronic small vessel disease changes. No acute intracranial abnormality. Specifically, no hemorrhage, hydrocephalus, mass lesion, acute infarction, or significant intracranial injury. No acute calvarial abnormality. Visualized paranasal sinuses and mastoids clear. Orbital soft tissues unremarkable. CT CERVICAL SPINE FINDINGS Severe diffuse degenerative facet disease. Severe degenerative disc disease in the lower cervical spine. Slight anterolisthesis of C3 on C4 and C4 on C5, likely related to facet disease. Prevertebral soft tissues are normal. No fracture. No epidural or paraspinal hematoma. IMPRESSION: No acute intracranial abnormality. Atrophy, chronic microvascular disease. Advanced spondylosis. No acute bony abnormality in the  cervical spine. Electronically Signed   By: Charlett Nose M.D.   On: 10/20/2015 11:24   Ct Cervical Spine Wo Contrast 10/20/2015  CLINICAL DATA:  Fall EXAM: CT HEAD WITHOUT CONTRAST CT CERVICAL SPINE WITHOUT CONTRAST TECHNIQUE: Multidetector CT imaging of the head and cervical spine was performed following the standard protocol without intravenous contrast. Multiplanar CT image reconstructions of the cervical spine were also generated. COMPARISON:  10/11/2015 FINDINGS: CT HEAD FINDINGS There is atrophy and chronic small vessel disease changes. No acute intracranial abnormality. Specifically, no hemorrhage, hydrocephalus, mass lesion, acute infarction, or significant intracranial injury. No acute calvarial abnormality. Visualized paranasal sinuses and mastoids clear. Orbital soft tissues unremarkable. CT CERVICAL SPINE FINDINGS Severe diffuse degenerative facet disease. Severe degenerative disc disease in the lower cervical spine. Slight anterolisthesis of C3 on C4 and C4 on C5, likely related to facet disease. Prevertebral soft tissues are normal. No fracture. No epidural or paraspinal hematoma. IMPRESSION: No acute intracranial abnormality. Atrophy, chronic microvascular disease. Advanced spondylosis. No acute bony abnormality in the cervical spine. Electronically Signed   By: Charlett Nose M.D.   On: 10/20/2015 11:24   Dg Hips Bilat With Pelvis 3-4 Views 10/20/2015  CLINICAL DATA:  Found on floor at nursing facility this morning. No reported complaints. Initial encounter. EXAM: DG HIP (WITH OR WITHOUT PELVIS) 3-4V BILAT COMPARISON:  Left hip radiographs 02/16/2015 FINDINGS: No acute fracture or dislocation is identified. There is mild hip joint space narrowing and chondrocalcinosis bilaterally. Vascular calcifications are noted. IMPRESSION: No acute osseous abnormality identified. Electronically Signed   By: Sebastian Ache M.D.   On: 10/20/2015 11:12    1130:  CT/XR reassuring. Will d/c pt back to NH, stable.      Samuel Jester, DO 10/24/15 [phone]

## 2015-10-20 NOTE — ED Notes (Signed)
[facility] called and on the way to get

## 2015-10-20 NOTE — ED Notes (Signed)
Per EMS pt [facility] fall [facility].  Pt denies any injury or pain.  Pt found sitting on bottom beside chair and wheelchair.

## 2015-10-20 NOTE — ED Notes (Signed)
Bed: [id number] Expected date:  Expected time:  Means of arrival:  Comments: EMS/89F/fall

## 2016-08-20 ENCOUNTER — Emergency Department (HOSPITAL_COMMUNITY): Payer: Medicare Other

## 2016-08-20 ENCOUNTER — Emergency Department (HOSPITAL_COMMUNITY)
Admission: EM | Admit: 2016-08-20 | Discharge: 2016-08-20 | Disposition: A | Discharge status: Home / Self Care | Payer: Medicare Other | Attending: Emergency Medicine | Admitting: Emergency Medicine

## 2016-08-20 DIAGNOSIS — Z79899 Other long term (current) drug therapy: Secondary | ICD-10-CM | POA: Insufficient documentation

## 2016-08-20 DIAGNOSIS — I10 Essential (primary) hypertension: Secondary | ICD-10-CM | POA: Diagnosis not present

## 2016-08-20 DIAGNOSIS — Z87891 Personal history of nicotine dependence: Secondary | ICD-10-CM | POA: Diagnosis not present

## 2016-08-20 DIAGNOSIS — R531 Weakness: Secondary | ICD-10-CM | POA: Diagnosis present

## 2016-08-20 DIAGNOSIS — N390 Urinary tract infection, site not specified: Secondary | ICD-10-CM | POA: Insufficient documentation

## 2016-08-20 DIAGNOSIS — G309 Alzheimer's disease, unspecified: Secondary | ICD-10-CM | POA: Insufficient documentation

## 2016-08-20 DIAGNOSIS — R4182 Altered mental status, unspecified: Secondary | ICD-10-CM | POA: Insufficient documentation

## 2016-08-20 DIAGNOSIS — Z7982 Long term (current) use of aspirin: Secondary | ICD-10-CM | POA: Insufficient documentation

## 2016-08-20 LAB — CBC WITH DIFFERENTIAL/PLATELET
BASOS ABS: 0 10*3/uL (ref 0.0–0.1)
BASOS PCT: 0 %
Eosinophils Absolute: 0.1 10*3/uL (ref 0.0–0.7)
Eosinophils Relative: 2 %
HEMATOCRIT: 41.9 % (ref 36.0–46.0)
Hemoglobin: 13.5 g/dL (ref 12.0–15.0)
LYMPHS PCT: 20 %
Lymphs Abs: 1.2 10*3/uL (ref 0.7–4.0)
MCH: 31.6 pg (ref 26.0–34.0)
MCHC: 32.2 g/dL (ref 30.0–36.0)
MCV: 98.1 fL (ref 78.0–100.0)
Monocytes Absolute: 0.4 10*3/uL (ref 0.1–1.0)
Monocytes Relative: 7 %
NEUTROS ABS: 4.2 10*3/uL (ref 1.7–7.7)
NEUTROS PCT: 70 %
Platelets: 111 10*3/uL — ABNORMAL LOW (ref 150–400)
RBC: 4.27 MIL/uL (ref 3.87–5.11)
RDW: 13.5 % (ref 11.5–15.5)
WBC: 6.1 10*3/uL (ref 4.0–10.5)

## 2016-08-20 LAB — COMPREHENSIVE METABOLIC PANEL
ALBUMIN: 3.5 g/dL (ref 3.5–5.0)
ALK PHOS: 71 U/L (ref 38–126)
ALT: 11 U/L — ABNORMAL LOW (ref 14–54)
AST: 19 U/L (ref 15–41)
Anion gap: 7 (ref 5–15)
BILIRUBIN TOTAL: 1 mg/dL (ref 0.3–1.2)
BUN: 57 mg/dL — AB (ref 6–20)
CO2: 32 mmol/L (ref 22–32)
Calcium: 10 mg/dL (ref 8.9–10.3)
Chloride: 108 mmol/L (ref 101–111)
Creatinine, Ser: 1.14 mg/dL — ABNORMAL HIGH (ref 0.44–1.00)
GFR calc Af Amer: 45 mL/min — ABNORMAL LOW (ref >60)
GFR calc non Af Amer: 39 mL/min — ABNORMAL LOW (ref >60)
GLUCOSE: 98 mg/dL (ref 65–99)
POTASSIUM: 3.9 mmol/L (ref 3.5–5.1)
SODIUM: 147 mmol/L — AB (ref 135–145)
TOTAL PROTEIN: 6.4 g/dL — AB (ref 6.5–8.1)

## 2016-08-20 LAB — URINALYSIS, ROUTINE W REFLEX MICROSCOPIC
Bilirubin Urine: NEGATIVE
Glucose, UA: NEGATIVE mg/dL
Ketones, ur: 5 mg/dL — AB
Leukocytes, UA: NEGATIVE
Nitrite: NEGATIVE
PROTEIN: 100 mg/dL — AB
RBC / HPF: NONE SEEN RBC/hpf (ref 0–5)
Specific Gravity, Urine: 1.017 (ref 1.005–1.030)
pH: 8 (ref 5.0–8.0)

## 2016-08-20 LAB — I-STAT CG4 LACTIC ACID, ED: Lactic Acid, Venous: 1.44 mmol/L (ref 0.5–1.9)

## 2016-08-20 LAB — I-STAT TROPONIN, ED: Troponin i, poc: 0.02 ng/mL (ref 0.00–0.08)

## 2016-08-20 MED ORDER — CEPHALEXIN 500 MG PO CAPS: 500.0000 mg | ORAL_CAPSULE | Freq: Three times a day (TID) | ORAL | 0 refills | Status: AC

## 2016-08-20 MED ORDER — ACETAMINOPHEN 650 MG RE SUPP
650.0000 mg | Freq: Once | RECTAL | Status: AC
  Administered 2016-08-20: 650 mg via RECTAL
  Filled 2016-08-20: qty 1

## 2016-08-20 MED ORDER — DEXTROSE 5 % IV SOLN
1.0000 g | Freq: Once | INTRAVENOUS | Status: AC
  Administered 2016-08-20: 1 g via INTRAVENOUS
  Filled 2016-08-20: qty 10

## 2016-08-20 MED ORDER — CEPHALEXIN 500 MG PO CAPS: 500.0000 mg | ORAL_CAPSULE | Freq: Four times a day (QID) | ORAL | 0 refills | Status: AC

## 2016-08-20 MED ORDER — CEPHALEXIN 500 MG PO CAPS
500.0000 mg | ORAL_CAPSULE | Freq: Once | ORAL | Status: AC
  Administered 2016-08-20: 500 mg via ORAL
  Filled 2016-08-20: qty 1

## 2016-08-20 NOTE — Discharge Instructions (Signed)
Mrs. Colleen Wise may require some additional assistance eating and drinking for the next few days. Encourage extra fluids. She may intermittently run fever and require Tylenol. She can have 650mg  of Tylenol every 4 hours. FRx: Keflex 3 times per day for 7 days.

## 2016-08-20 NOTE — ED Notes (Signed)
Bed: [id number] Expected date:  Expected time:  Means of arrival:  Comments: 83 yo AMS

## 2016-08-20 NOTE — ED Provider Notes (Signed)
[facility]-EMERGENCY DEPT Provider Note   CSN: [id number] Arrival date & time: 08/20/16  1449     History   Chief Complaint Chief Complaint  Patient presents with  . Altered Mental Status  . Fatigue    HPI Colleen Wise is a 83 y.o. female. CC:  Altered mental status.  HPI:  [facility], [facility] care unit. Described as "more lethargic than normal". Has history of Alzheimer's dementia. Staff reports increased frequency of urination. Transferred via EMS.  No additional report. Patient is nonverbal.  Past Medical History:  Diagnosis Date  . Alzheimer disease   . Glaucoma   . Heart disease   . Hypertension   . Osteoporosis     There are no active problems to display for this patient.   No past surgical history on file.  OB History    No data available       Home Medications    Prior to Admission medications   Medication Sig Start Date End Date Taking? Authorizing Provider  acetaminophen (TYLENOL) 500 MG tablet Take 500 mg by mouth every 4 (four) hours as needed for mild pain, fever or headache.   Yes Historical Provider, MD  alendronate (FOSAMAX) 70 MG tablet Take 70 mg by mouth every Wednesday.    Yes Historical Provider, MD  alum & mag hydroxide-simeth (MAALOX/MYLANTA) 200-200-20 MG/5ML suspension Take 30 mLs by mouth as needed for indigestion or heartburn (do not exceed 4 doses in 24 hours).    Yes Historical Provider, MD  aspirin EC 81 MG tablet Take 81 mg by mouth daily with breakfast.   Yes Historical Provider, MD  benazepril (LOTENSIN) 20 MG tablet Take 20 mg by mouth daily with breakfast.    Yes Historical Provider, MD  Calcium Carb-Cholecalciferol (CALCIUM-VITAMIN D) 600-400 MG-UNIT TABS Take 1 tablet by mouth 2 (two) times daily.   Yes Historical Provider, MD  Cholecalciferol (VITAMIN D3) 2000 units TABS Take 2,000 Units by mouth daily.    Yes Historical Provider, MD  citalopram (CELEXA) 20 MG tablet Take 20 mg by mouth daily  with breakfast.    Yes Historical Provider, MD  divalproex (DEPAKOTE SPRINKLE) 125 MG capsule Take 125 mg by mouth 2 (two) times daily.   Yes Historical Provider, MD  guaifenesin (ROBITUSSIN) 100 MG/5ML syrup Take 200 mg by mouth every 6 (six) hours as needed for cough.   Yes Historical Provider, MD  latanoprost (XALATAN) 0.005 % ophthalmic solution Place 1 drop into both eyes at bedtime.   Yes Historical Provider, MD  loperamide (IMODIUM) 2 MG capsule Take 2 mg by mouth as needed for diarrhea or loose stools (do not exceed 8 doses in 24 hours).   Yes Historical Provider, MD  magnesium hydroxide (MILK OF MAGNESIA) 400 MG/5ML suspension Take 30 mLs by mouth daily as needed for mild constipation.   Yes Historical Provider, MD  Memantine HCl ER (NAMENDA XR) 28 MG CP24 Take 1 capsule by mouth daily with breakfast.    Yes Historical Provider, MD  neomycin-bacitracin-polymyxin (NEOSPORIN) ointment Apply 1 application topically as needed for wound care.    Yes Historical Provider, MD  Polyethyl Glycol-Propyl Glycol (SYSTANE) 0.4-0.3 % SOLN Apply 1 drop to eye 2 (two) times daily.   Yes Historical Provider, MD  propranolol (INDERAL) 20 MG tablet Take 20 mg by mouth 2 (two) times daily.   Yes Historical Provider, MD  Skin Protectants, Misc. (BAZA PROTECT EX) Apply 1 application topically daily. Apply to buttocks  Yes Historical Provider, MD  cephALEXin (KEFLEX) 500 MG capsule Take 1 capsule (500 mg total) by mouth 4 (four) times daily. 08/20/16   [name], MD  cephALEXin (KEFLEX) 500 MG capsule Take 1 capsule (500 mg total) by mouth 3 (three) times daily. 08/20/16   [name], MD    Family History No family history on file.  Social History Social History  Substance Use Topics  . Smoking status: Former Games developer  . Smokeless tobacco: Not on file  . Alcohol use No     Allergies   Dairy aid [lactase] and Eggs or egg-derived products   Review of Systems Review of Systems  Unable to perform ROS:  Dementia  Genitourinary: Positive for frequency.  Neurological: Positive for weakness.     Physical Exam Updated Vital Signs BP 146/82 (BP Location: Right Arm)   Pulse 76   Temp 97.8 F (36.6 C) (Rectal)   Resp 16   SpO2 93%   Physical Exam  Constitutional:  Thin frail elderly appearing 83 year old female. Hands curled against her chest. Huddled under a blanket.  HENT:  Lips dried. Conjunctiva not pale.  Eyes:  3-4 Millimeters, equal reactive  Neck:  Supple.  Cardiovascular:  Heart rate 65. No murmurs rubs gallops. No dependent edema. No S4 gallop. No JVD.  Pulmonary/Chest:  Clear bilateral breath sounds. No increased worker breathing.  Abdominal:  Scaphoid, soft benign. Normal active bowel sounds.  Neurological:  Eyes will open to voice. Nonverbal. Does move all 4 extremities. Does not follow commands.  Skin:  No dependent edema. No rash or cellulitis of the skin.  Psychiatric:  Home. Sleeping intermittently.     ED Treatments / Results  Labs (all labs ordered are listed, but only abnormal results are displayed) Labs Reviewed  URINALYSIS, ROUTINE W REFLEX MICROSCOPIC - Abnormal; Notable for the following:       Result Value   APPearance TURBID (*)    Hgb urine dipstick SMALL (*)    Ketones, ur 5 (*)    Protein, ur 100 (*)    Bacteria, UA MANY (*)    Squamous Epithelial / LPF 6-30 (*)    All other components within normal limits  CBC WITH DIFFERENTIAL/PLATELET - Abnormal; Notable for the following:    Platelets 111 (*)    All other components within normal limits  COMPREHENSIVE METABOLIC PANEL - Abnormal; Notable for the following:    Sodium 147 (*)    BUN 57 (*)    Creatinine, Ser 1.14 (*)    Total Protein 6.4 (*)    ALT 11 (*)    GFR calc non Af Amer 39 (*)    GFR calc Af Amer 45 (*)    All other components within normal limits  URINE CULTURE  I-STAT CG4 LACTIC ACID, ED  I-STAT TROPOININ, ED    EKG  EKG Interpretation  Date/Time:  Friday  August 20 2016 15:32:00 EST Ventricular Rate:  59 PR Interval:    QRS Duration: 97 QT Interval:  451 QTC Calculation: 447 R Axis:   0 Text Interpretation:  Sinus rhythm Atrial premature complex Abnormal R-wave progression, early transition Abnormal T, consider ischemia, diffuse leads Confirmed by Fayrene Fearing  MD, Vasilisa Vore ([id number]) on 08/20/2016 3:38:47 PM       Radiology Dg Chest 2 View  Result Date: 08/20/2016 CLINICAL DATA:  Lethargy and altered mentation. Alzheimer's disease. EXAM: CHEST  2 VIEW COMPARISON:  10/20/2015 CXR, CT 10/18/2006 FINDINGS: Heart is normal in size. There is aortic atherosclerosis  with ectasia. No aneurysm. Impression scoliotic curvature of the thoracolumbar spine with multilevel degenerative disc disease and osteophytes. Chronic stable granuloma at the left lung base. No pulmonary consolidation. Minimal atelectasis at the left lung base. IMPRESSION: No active cardiopulmonary disease. Stable left lower lobe granuloma. Aortic atherosclerosis. Electronically Signed   By: Tollie Eth M.D.   On: 08/20/2016 16:03    Procedures Procedures (including critical care time)  Medications Ordered in ED Medications  cephALEXin (KEFLEX) capsule 500 mg (not administered)  acetaminophen (TYLENOL) suppository 650 mg (650 mg Rectal Given 08/20/16 1705)  cefTRIAXone (ROCEPHIN) 1 g in dextrose 5 % 50 mL IVPB (1 g Intravenous New Bag/Given 08/20/16 1807)     Initial Impression / Assessment and Plan / ED Course  I have reviewed the triage vital signs and the nursing notes.  Pertinent labs & imaging results that were available during my care of the patient were reviewed by me and considered in my medical decision making (see chart for details).     83 year old with history of dementia. Currently nonverbal. Per report is less active than baseline. We'll plan lab urine and chest x-ray for infectious disease evaluation. Metabolic workup with labs. No indication for imaging at this time.  15:39:   EKG was sinus rhythm. T-wave inversions anterolateral V4 through V6 versus comparison of 10-11-2015. Will add troponin to labs.  Final Clinical Impressions(s) / ED Diagnoses   Final diagnoses:  Lower urinary tract infectious disease  Altered mental status, unspecified altered mental status type    18:51:  On recheck patient appears much better. She is sitting up. She is interacting with family. She has had by mouth fluids. She is taking a pill by mouth and is a little bit to eat. Family states she she appears at her baseline. I engaged him in chair decision-making. Of offer to admission. However discouraged this as there is a great of population inpatient with influenza. They felt very strongly like the patient to go back to her care facility. I think this is an appropriate disposition. Was given IV Rocephin. Prescription for Keflex 3 times a day. I've written instructions to her facility that she may required additional assistance with exudates including foods fluids and medication. Recheck here with any worsening.  New Prescriptions New Prescriptions   CEPHALEXIN (KEFLEX) 500 MG CAPSULE    Take 1 capsule (500 mg total) by mouth 4 (four) times daily.   CEPHALEXIN (KEFLEX) 500 MG CAPSULE    Take 1 capsule (500 mg total) by mouth 3 (three) times daily.     [name], MD 08/20/16 [id number]

## 2016-08-20 NOTE — ED Notes (Signed)
Colleen Wise [phone].  Please call Mrs. Colleen Wise daughter if there is any change.

## 2016-08-20 NOTE — ED Notes (Signed)
Attempted to call report to [facility].  Will try again.

## 2016-08-20 NOTE — ED Notes (Signed)
Called pt's daughter (HPOA) and updated her on pt's condition and discharge instructions.

## 2016-08-20 NOTE — ED Notes (Signed)
[facility]

## 2016-08-20 NOTE — ED Notes (Signed)
Gave pt ice water which she was able to drink from a straw without any difficulty.  Pt also given vaseline on her very dry lips after washing them with a moist washcloth

## 2016-08-20 NOTE — ED Triage Notes (Addendum)
Per [facility], pt presents more lethargic and mentation is deviated from Alzheimer's baseline. Facility staff suspects UTI d/t frequent urination. Pt received approximately 400mL of NS IV en route to this ED.

## 2016-08-21 ENCOUNTER — Encounter (HOSPITAL_COMMUNITY): Payer: Self-pay | Admitting: Emergency Medicine

## 2016-08-21 ENCOUNTER — Emergency Department (HOSPITAL_COMMUNITY)
Admission: EM | Admit: 2016-08-21 | Discharge: 2016-08-21 | Disposition: A | Discharge status: Home / Self Care | Payer: Medicare Other | Attending: Emergency Medicine | Admitting: Emergency Medicine

## 2016-08-21 DIAGNOSIS — Z87891 Personal history of nicotine dependence: Secondary | ICD-10-CM | POA: Insufficient documentation

## 2016-08-21 DIAGNOSIS — N939 Abnormal uterine and vaginal bleeding, unspecified: Secondary | ICD-10-CM | POA: Diagnosis not present

## 2016-08-21 DIAGNOSIS — G309 Alzheimer's disease, unspecified: Secondary | ICD-10-CM | POA: Insufficient documentation

## 2016-08-21 DIAGNOSIS — I10 Essential (primary) hypertension: Secondary | ICD-10-CM | POA: Insufficient documentation

## 2016-08-21 DIAGNOSIS — Z7982 Long term (current) use of aspirin: Secondary | ICD-10-CM | POA: Diagnosis not present

## 2016-08-21 NOTE — ED Notes (Signed)
Bed: WA09 Expected date:  Expected time:  Means of arrival:  Comments: EMS 

## 2016-08-21 NOTE — ED Provider Notes (Signed)
[facility]-EMERGENCY DEPT Provider Note   CSN: [id number] Arrival date & time: 08/21/16  4098  By signing my name below, I, [name], attest that this documentation has been prepared under the direction and in the presence of [name], MD. Electronically Signed: [name], ED Scribe. 08/21/16. 8:24 PM.  History   Chief Complaint Chief Complaint  Patient presents with  . Vaginal Bleeding   LEVEL V CAVEAT: HPI and ROS limited due to dementia   The history is provided by the patient, medical records and a relative. History limited by: dementia. No language interpreter was used.    HPI Comments: Colleen Wise is a 83 y.o. female BIB EMS from [facility], with a h/o Alzheimer's disease, who presents to the Emergency Department with vaginal bleeding which was noticed by her nursing home earlier this afternoon. Per family, [employer] had observed this and they described it as the passage of dark-red blood into her toilet. This is the first known episode of this, per family. Pt denies any abdominal pain, or pain otherwise. Her family member also notes that she has been increasingly fatigued lately, and that her left eye has been drooping.  Per prior chart review, pt was seen in the ED yesterday and at that time she was dx'd with a UTI. Pt was placed on Keflex upon d/c. Pt takes 81mg  Asprin daily, but is not on anticoagulant/antiplatelet therapy otherwise.   Past Medical History:  Diagnosis Date  . Alzheimer disease   . Glaucoma   . Heart disease   . Hypertension   . Osteoporosis    There are no active problems to display for this patient.  History reviewed. No pertinent surgical history.  OB History    No data available     Home Medications    Prior to Admission medications   Medication Sig Start Date End Date Taking? Authorizing Provider  acetaminophen (TYLENOL) 500 MG tablet Take 500 mg by mouth every 4 (four) hours as needed for mild pain, fever or  headache.    Historical Provider, MD  alendronate (FOSAMAX) 70 MG tablet Take 70 mg by mouth every Wednesday.     Historical Provider, MD  alum & mag hydroxide-simeth (MAALOX/MYLANTA) 200-200-20 MG/5ML suspension Take 30 mLs by mouth as needed for indigestion or heartburn (do not exceed 4 doses in 24 hours).     Historical Provider, MD  aspirin EC 81 MG tablet Take 81 mg by mouth daily with breakfast.    Historical Provider, MD  benazepril (LOTENSIN) 20 MG tablet Take 20 mg by mouth daily with breakfast.     Historical Provider, MD  Calcium Carb-Cholecalciferol (CALCIUM-VITAMIN D) 600-400 MG-UNIT TABS Take 1 tablet by mouth 2 (two) times daily.    Historical Provider, MD  cephALEXin (KEFLEX) 500 MG capsule Take 1 capsule (500 mg total) by mouth 4 (four) times daily. 08/20/16   [name], MD  cephALEXin (KEFLEX) 500 MG capsule Take 1 capsule (500 mg total) by mouth 3 (three) times daily. 08/20/16   [name], MD  Cholecalciferol (VITAMIN D3) 2000 units TABS Take 2,000 Units by mouth daily.     Historical Provider, MD  citalopram (CELEXA) 20 MG tablet Take 20 mg by mouth daily with breakfast.     Historical Provider, MD  divalproex (DEPAKOTE SPRINKLE) 125 MG capsule Take 125 mg by mouth 2 (two) times daily.    Historical Provider, MD  guaifenesin (ROBITUSSIN) 100 MG/5ML syrup Take 200 mg by mouth every 6 (  six) hours as needed for cough.    Historical Provider, MD  latanoprost (XALATAN) 0.005 % ophthalmic solution Place 1 drop into both eyes at bedtime.    Historical Provider, MD  loperamide (IMODIUM) 2 MG capsule Take 2 mg by mouth as needed for diarrhea or loose stools (do not exceed 8 doses in 24 hours).    Historical Provider, MD  magnesium hydroxide (MILK OF MAGNESIA) 400 MG/5ML suspension Take 30 mLs by mouth daily as needed for mild constipation.    Historical Provider, MD  Memantine HCl ER (NAMENDA XR) 28 MG CP24 Take 1 capsule by mouth daily with breakfast.     Historical Provider, MD    neomycin-bacitracin-polymyxin (NEOSPORIN) ointment Apply 1 application topically as needed for wound care.     Historical Provider, MD  Polyethyl Glycol-Propyl Glycol (SYSTANE) 0.4-0.3 % SOLN Apply 1 drop to eye 2 (two) times daily.    Historical Provider, MD  propranolol (INDERAL) 20 MG tablet Take 20 mg by mouth 2 (two) times daily.    Historical Provider, MD  Skin Protectants, Misc. (BAZA PROTECT EX) Apply 1 application topically daily. Apply to buttocks    Historical Provider, MD   Family History History reviewed. No pertinent family history.  Social History Social History  Substance Use Topics  . Smoking status: Former Games developer  . Smokeless tobacco: Never Used  . Alcohol use No   Allergies   Dairy aid [lactase] and Eggs or egg-derived products  Review of Systems Review of Systems  Unable to perform ROS: Dementia   Physical Exam Updated Vital Signs BP 166/57 (BP Location: Left Arm)   Pulse (!) 56   Temp 97.6 F (36.4 C) (Oral)   Resp 16   SpO2 93%   Physical Exam  Constitutional:  Elderly and frail appearing, but otherwise in NAD.   HENT:  Head: Normocephalic.  Right Ear: External ear normal.  Left Ear: External ear normal.  Nose: Nose normal.  Mouth/Throat: Oropharynx is clear and moist.  Eyes: Conjunctivae are normal. Right eye exhibits no discharge. Left eye exhibits no discharge.  Neck: Normal range of motion.  Cardiovascular: Normal rate, regular rhythm and normal heart sounds.   No murmur heard. Pulmonary/Chest: Effort normal and breath sounds normal. No respiratory distress. She has no wheezes. She has no rales.  Abdominal: Soft. She exhibits no distension. There is no tenderness. There is no rebound and no guarding.  Genitourinary:  Genitourinary Comments: GU: Chaperone present throughout entire exam. One small blot clot noted at vaginal entroitus. No discrete source identifies. No mass or concerning lesions noted.   Musculoskeletal: Normal range of motion.  She exhibits no edema or tenderness.  Neurological:  Pleasantly confused. Moves all extremities without noticeable difficulty.   Skin: Skin is warm and dry. No rash noted. No erythema. No pallor.  Psychiatric: She has a normal mood and affect. Her behavior is normal.  Nursing note and vitals reviewed.  ED Treatments / Results  DIAGNOSTIC STUDIES: Oxygen Saturation is 93% on RA, adequate by my interpretation.   COORDINATION OF CARE: 8:22 PM-Discussed next steps with pt and family. Family verbalized understanding and is agreeable with the plan.   Labs (all labs ordered are listed, but only abnormal results are displayed) Labs Reviewed - No data to display  EKG  EKG Interpretation None      Radiology Dg Chest 2 View  Result Date: 08/20/2016 CLINICAL DATA:  Lethargy and altered mentation. Alzheimer's disease. EXAM: CHEST  2 VIEW COMPARISON:  10/20/2015  CXR, CT 10/18/2006 FINDINGS: Heart is normal in size. There is aortic atherosclerosis with ectasia. No aneurysm. Impression scoliotic curvature of the thoracolumbar spine with multilevel degenerative disc disease and osteophytes. Chronic stable granuloma at the left lung base. No pulmonary consolidation. Minimal atelectasis at the left lung base. IMPRESSION: No active cardiopulmonary disease. Stable left lower lobe granuloma. Aortic atherosclerosis. Electronically Signed   By: Tollie Ethavid  Kwon M.D.   On: 08/20/2016 16:03   Procedures Procedures   Medications Ordered in ED Medications - No data to display  Initial Impression / Assessment and Plan / ED Course  I have reviewed the triage vital signs and the nursing notes.  Pertinent labs & imaging results that were available during my care of the patient were reviewed by me and considered in my medical decision making (see chart for details).     89yF with vaginal bleeding. Small clot noted at introitus. No other blood noted and no clear source. Abdominal exam benign. She is in NAD. If  she were my mother or grandmother I would observe her at this point. If she has continued bleeding then I would pursue possible GYN evaluation, endometrial biopsy, imaging, etc. Discussed with daughter at bedside. She is in agreement with this.   Final Clinical Impressions(s) / ED Diagnoses   Final diagnoses:  Vaginal bleeding   New Prescriptions New Prescriptions   No medications on file   I personally preformed the services scribed in my presence. The recorded information has been reviewed is accurate. Raeford RazorStephen Jarquez Mestre, MD.      Raeford RazorStephen Jarrel Knoke, MD 09/01/16 [phone]

## 2016-08-21 NOTE — ED Triage Notes (Signed)
Brought in by EMS from [facility] with c/o vaginal bleeding.  Pt was taken to the bathroom this afternoon--- staff observed "dark-red blood with clots" in commode.  Pt was diagnosed with UTI yesterday and was started on oral antibiotics.  Pt on ASA 81 mg daily.  Pt has Alzheimer's Dementia; pt denies any pain.

## 2016-08-21 NOTE — ED Notes (Signed)
[facility] was called and notified of pt's need of transportation back to [facility].

## 2016-08-21 NOTE — Discharge Instructions (Signed)
I did note a small clot at the entrance of the vagina on my exam today. I did not see a clear source of the bleeding. The blood may potentially be from recently diagnosed urinary tract infection. At this time recommend continued treatment of her UTI. If she does have continued bleeding recommending further evaluation.

## 2016-08-22 LAB — URINE CULTURE

## 2017-10-13 IMAGING — CR DG HAND COMPLETE 3+V*R*
3 series · 3 of 3 positions shown · non-contrast
Comparison: None.

CLINICAL DATA: Right hand pain after fall

EXAM:
RIGHT HAND - COMPLETE 3+ VIEW

[hand pa]
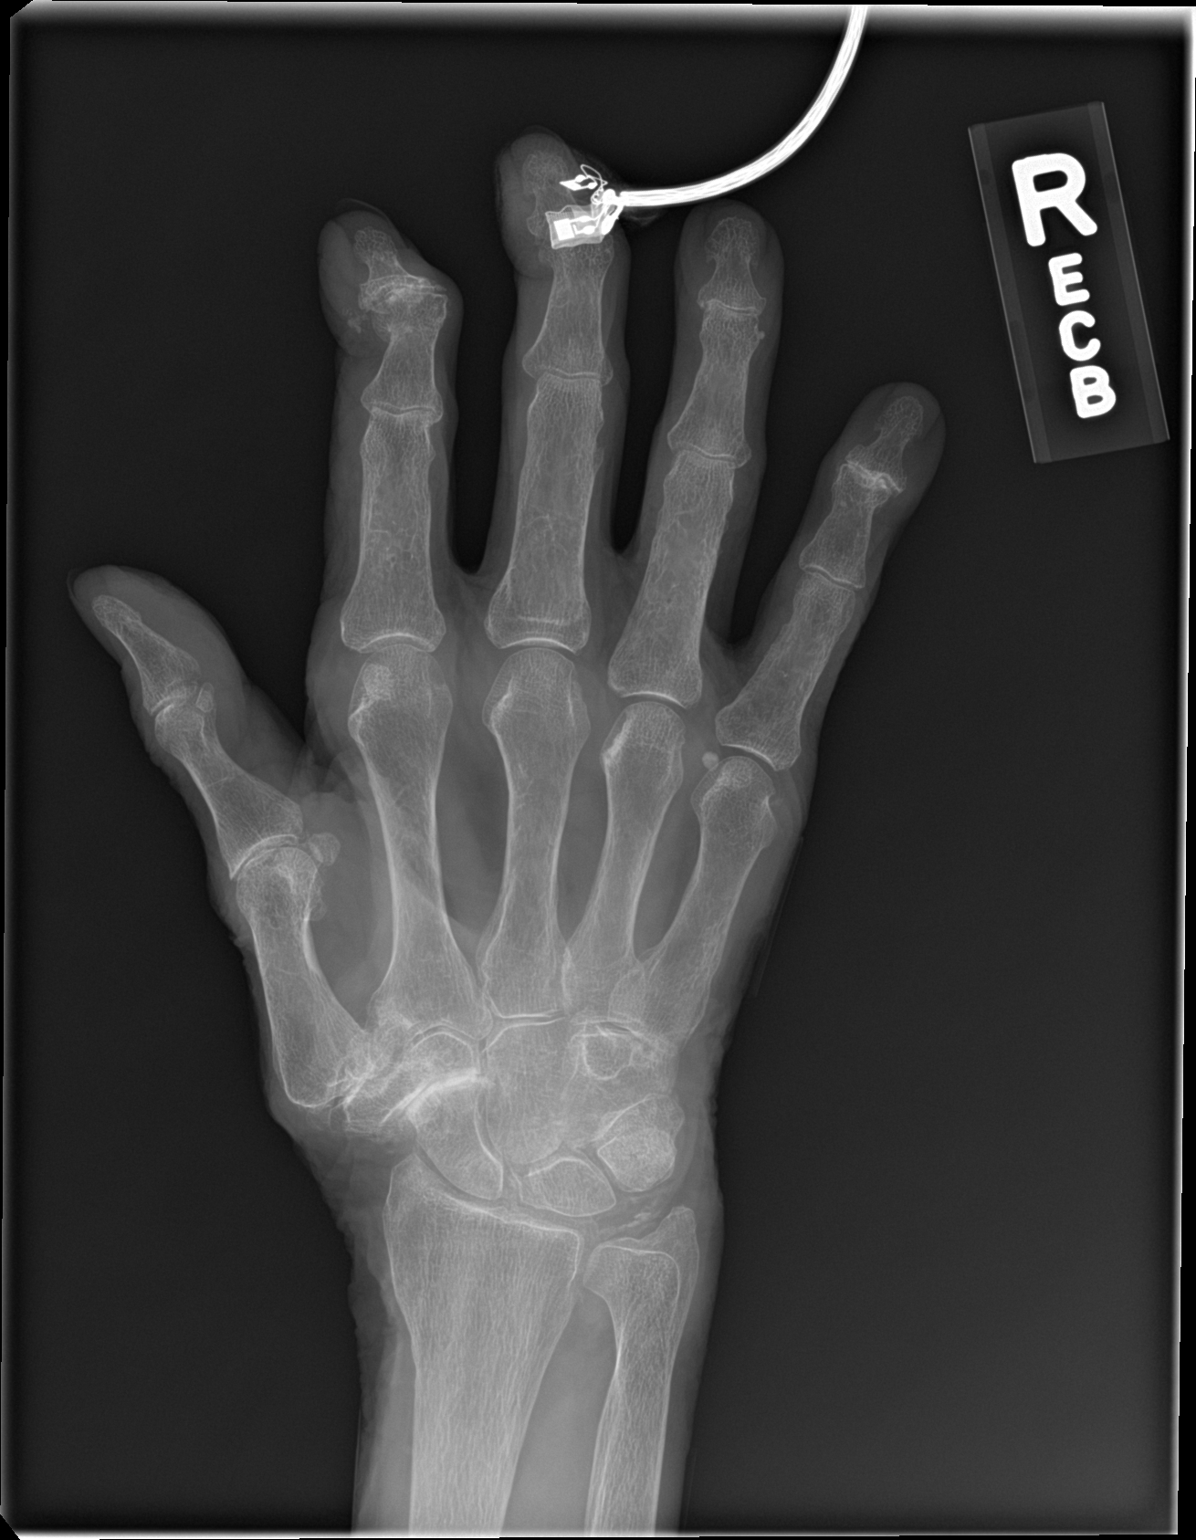

[hand obl]
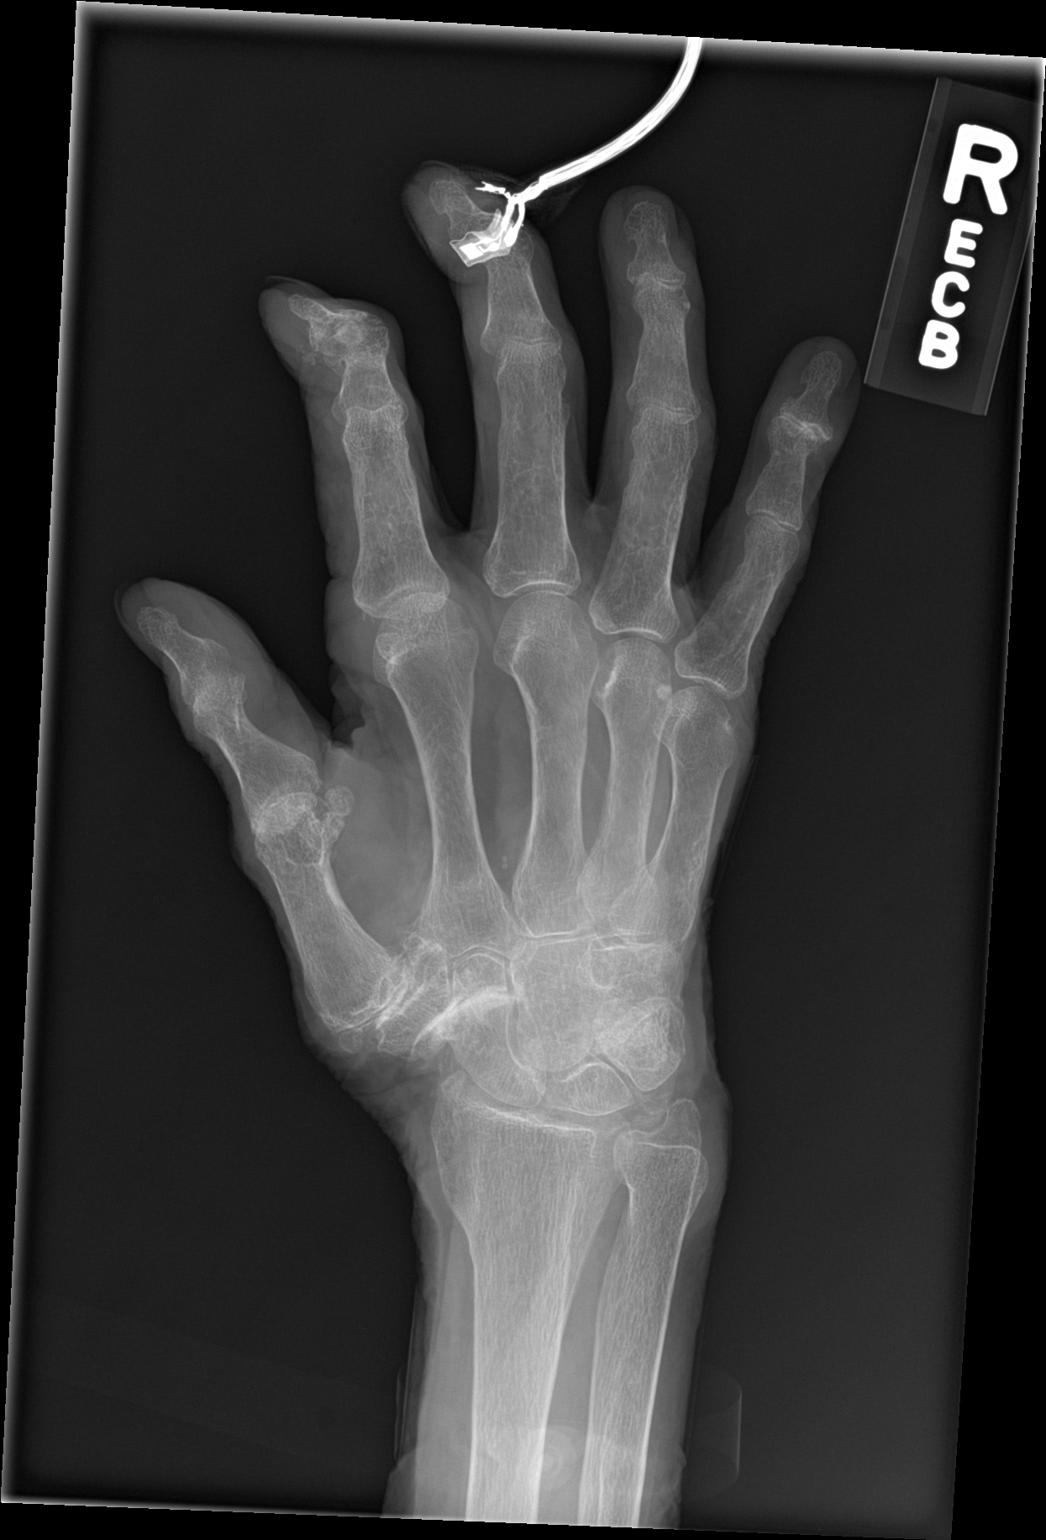

[hand lat]
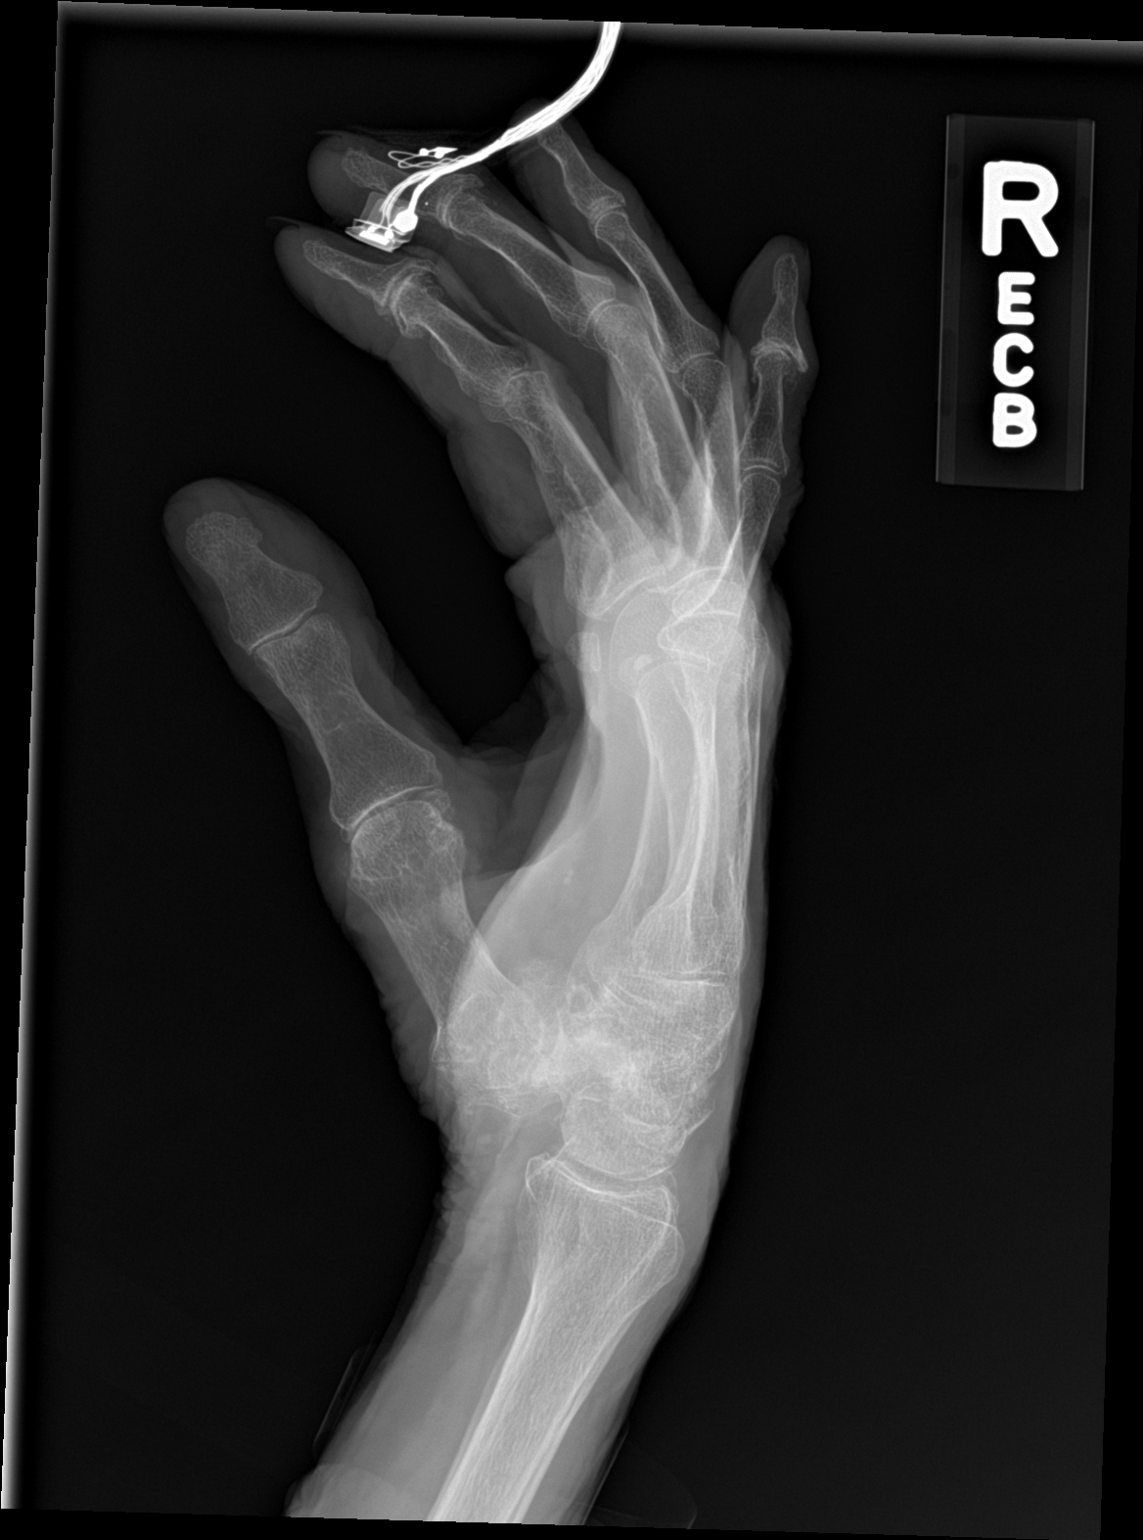

[3 of 3 positions shown; findings below may reference images not displayed]

FINDINGS: Pulse oximeter obscures visualization of the distal right third
finger. No fracture, dislocation or suspicious focal osseous lesion.
Severe osteoarthritis at the first carpometacarpal joint. Severe
osteoarthritis throughout the distal interphalangeal joints of the
second through fifth fingers with central erosions.
Chondrocalcinosis in the triangular fibrocartilage. Severe
osteoarthritis at the distal scaphoid articulations.
IMPRESSION: 1. No fracture or dislocation.
2. Severe polyarticular erosive osteoarthritis.

## 2017-10-13 IMAGING — CT CT HEAD W/O CM
3 series · 17 of 37 positions shown, 19 images · non-contrast
Comparison: 02/16/2015

CLINICAL DATA: Found on floor at nursing facility.

EXAM:
CT HEAD WITHOUT CONTRAST
CT CERVICAL SPINE WITHOUT CONTRAST
TECHNIQUE: Multidetector CT imaging of the head and cervical spine was
performed following the standard protocol without intravenous
contrast. Multiplanar CT image reconstructions of the cervical spine
were also generated.

[Series 4: c_spine 2.0 i30s 3 · axial · 0.35mm/px · z∈[-279,-141]mm · 8 of 87 slices shown]
[im 9/87  brain]
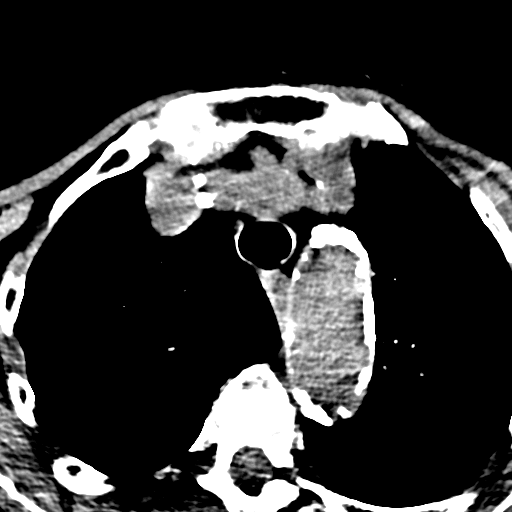
[im 17/87  brain]
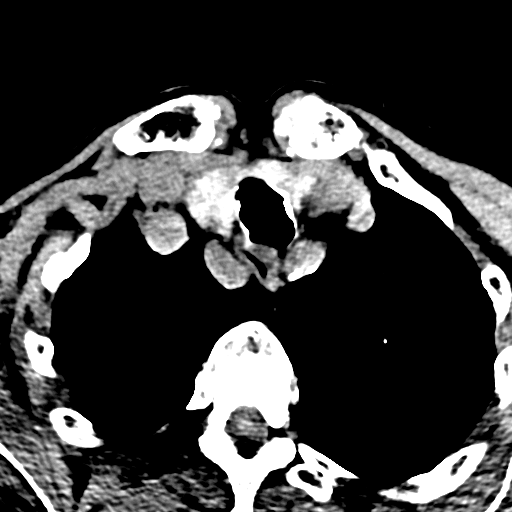
[im 29/87  brain]
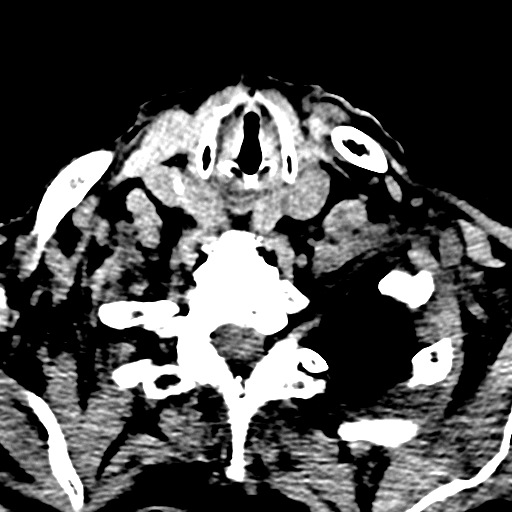
[im 37/87  brain]
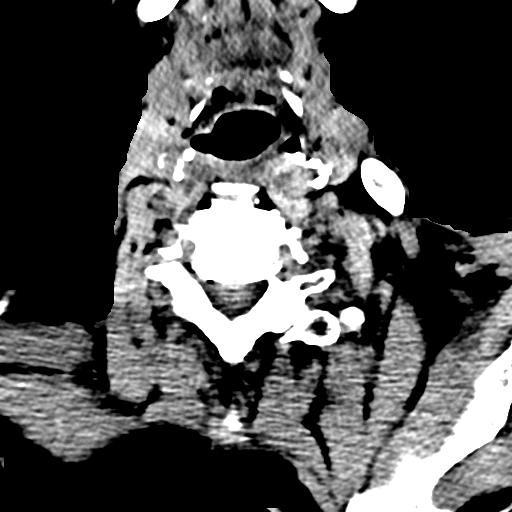
[im 50/87  brain]
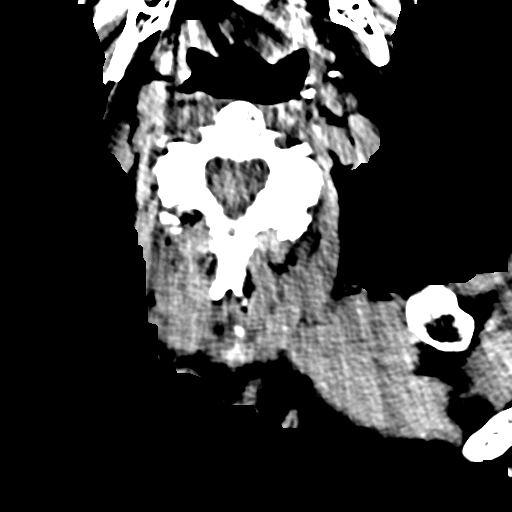
[im 58/87  brain]
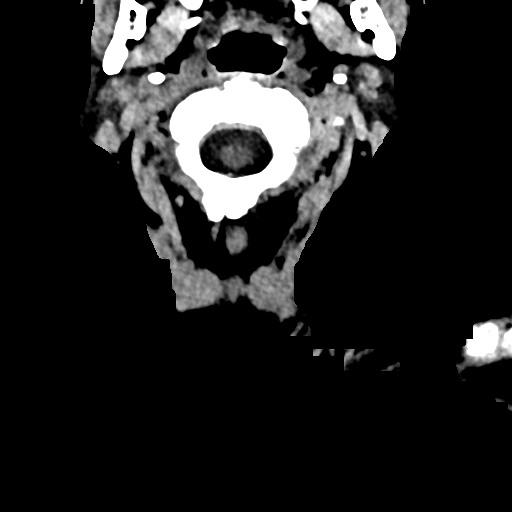
[im 70/87  brain]
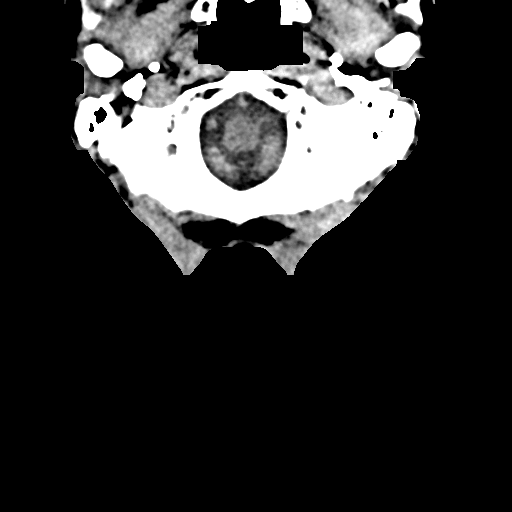
[im 78/87  brain]
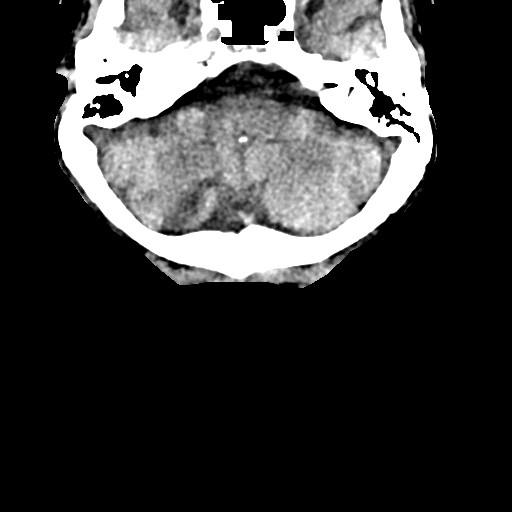

[Series 6: head 5.0 h30s · axial · 0.42mm/px · z∈[-132,-32]mm · 6 of 29 slices shown, 8 images]
[im 5/29  brain]
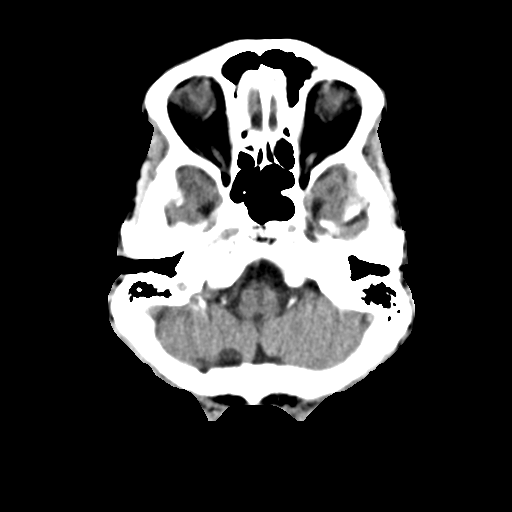
[im 5/29  bone]
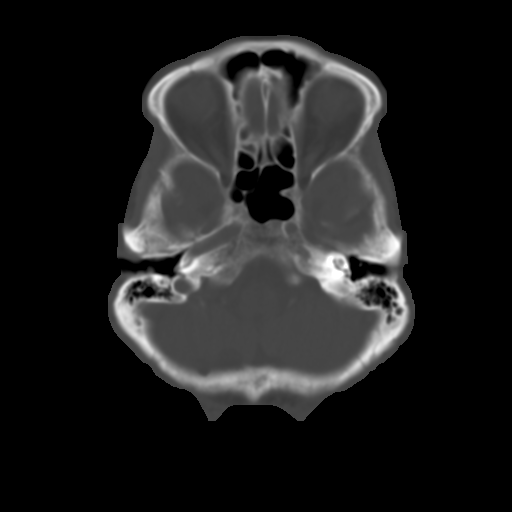
[im 9/29  brain]
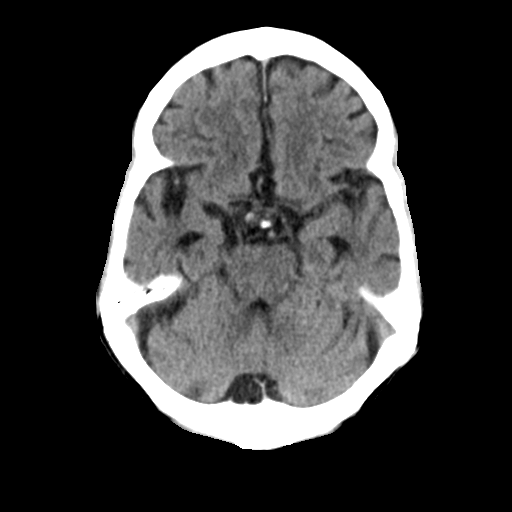
[im 13/29  brain]
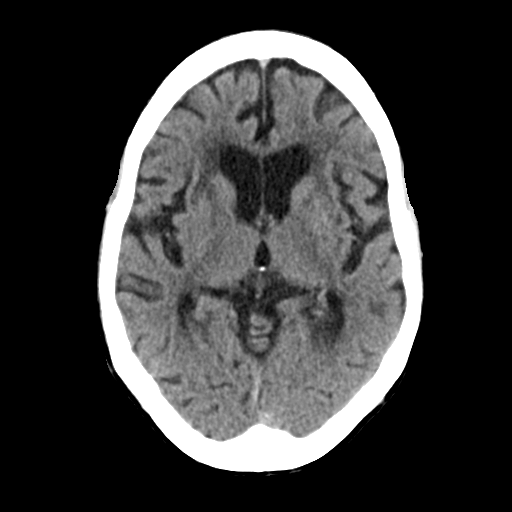
[im 17/29  brain]
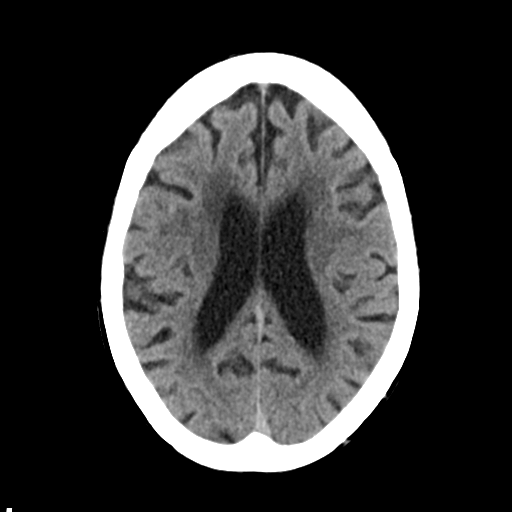
[im 21/29  brain]
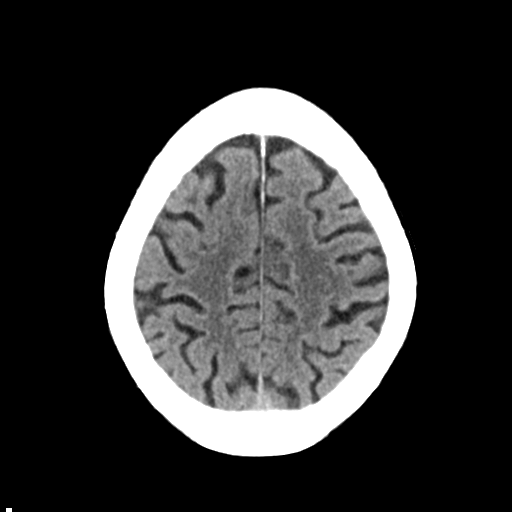
[im 21/29  bone]
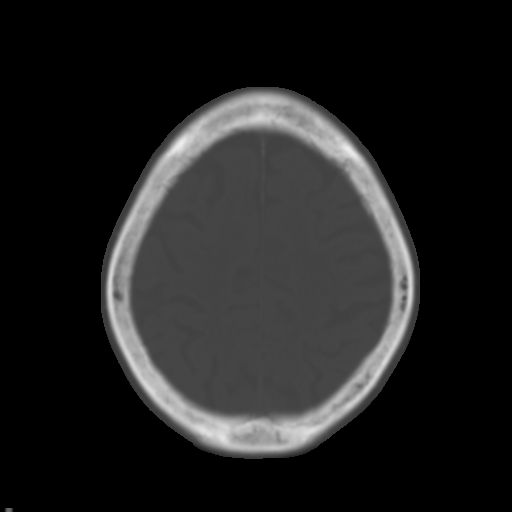
[im 25/29  brain]
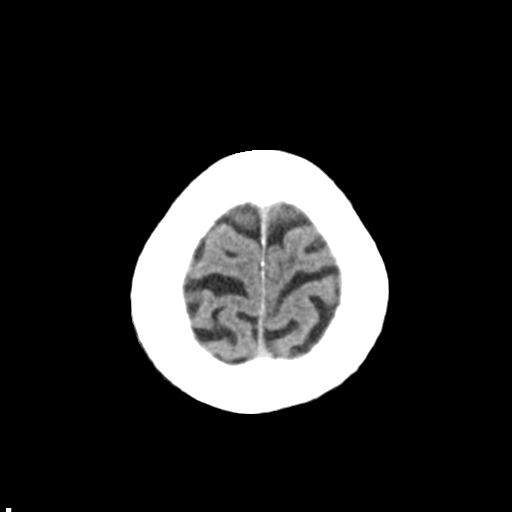

[Series 603: <mpr thick range> · sagittal · 0.35mm/px · 3 of 55 slices shown]
[im 19/55  brain]
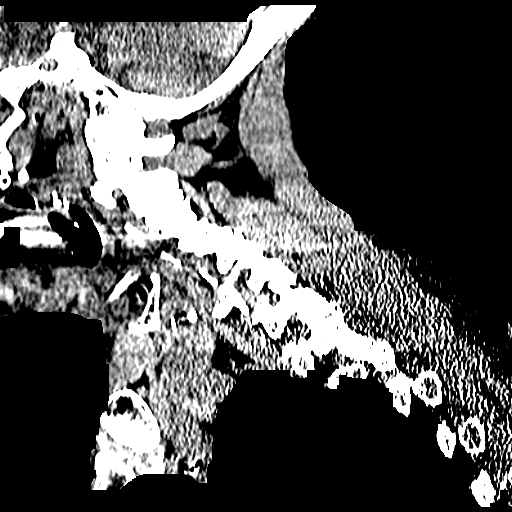
[im 28/55  brain]
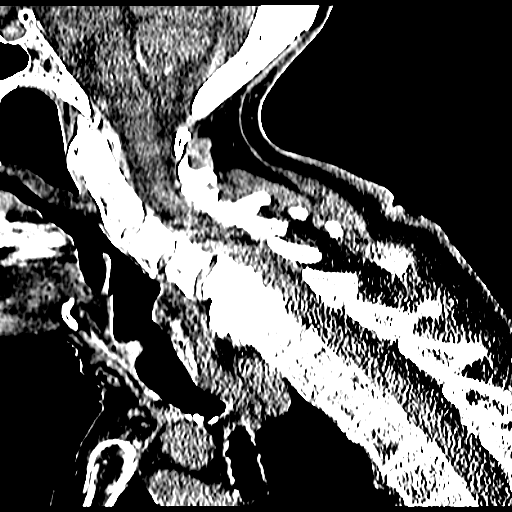
[im 37/55  brain]
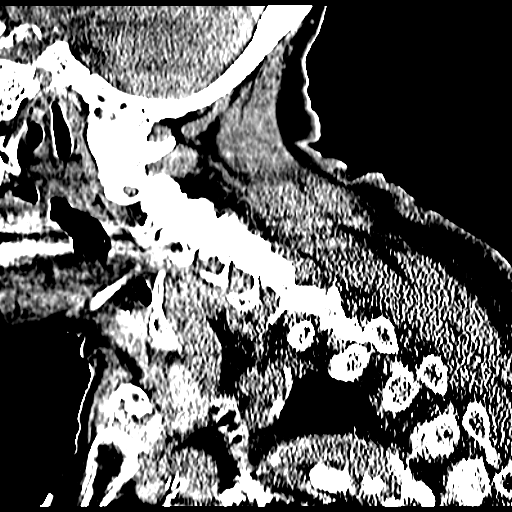

[17 of 37 positions shown; findings below may reference images not displayed]

FINDINGS: CT HEAD FINDINGS

Mild cerebral atrophy is unchanged. Again noted is diffuse low
density throughout the white matter which appears chronic. No
evidence for acute hemorrhage, mass lesion, midline shift,
hydrocephalus or large infarct. No calvarial fracture.

CT CERVICAL SPINE FINDINGS

Lung apices are clear. Negative for an acute fracture or
dislocation. Aortic arch is heavily calcified. No significant soft
tissue swelling in the neck. Severe disc space loss at C5-C6 and
C6-C7. Bilateral facet arthropathy. Stable grade 1 anterolisthesis
at C4-C5.
IMPRESSION: No acute intracranial abnormality. Evidence for chronic small vessel
ischemic changes.

No acute bone abnormality in cervical spine. Degenerative disc
disease at C5-C7.
